# Patient Record
Sex: Male | Born: 1975
Health system: Southern US, Community
[De-identification: ages and names within clinical notes are randomized; demographics above are authoritative.]

## PROBLEM LIST (undated history)

## (undated) DIAGNOSIS — J45909 Unspecified asthma, uncomplicated: Secondary | ICD-10-CM

## (undated) HISTORY — DX: Unspecified asthma, uncomplicated: J45.909

---

## 2004-06-29 ENCOUNTER — Emergency Department: Payer: Self-pay | Admitting: Emergency Medicine

## 2015-12-03 ENCOUNTER — Encounter: Payer: Self-pay | Admitting: Family Medicine

## 2015-12-03 ENCOUNTER — Ambulatory Visit (INDEPENDENT_AMBULATORY_CARE_PROVIDER_SITE_OTHER): Payer: BLUE CROSS/BLUE SHIELD | Admitting: Family Medicine

## 2015-12-03 VITALS — BP 133/88 | HR 58 | Temp 98.5°F | Ht 63.5 in | Wt 169.0 lb

## 2015-12-03 DIAGNOSIS — J45909 Unspecified asthma, uncomplicated: Secondary | ICD-10-CM | POA: Insufficient documentation

## 2015-12-03 DIAGNOSIS — J4540 Moderate persistent asthma, uncomplicated: Secondary | ICD-10-CM

## 2015-12-03 DIAGNOSIS — Z23 Encounter for immunization: Secondary | ICD-10-CM | POA: Diagnosis not present

## 2015-12-03 MED ORDER — MONTELUKAST SODIUM 10 MG PO TABS: 10.0000 mg | ORAL_TABLET | Freq: Every day | ORAL | Status: DC

## 2015-12-03 MED ORDER — ALBUTEROL SULFATE HFA 108 (90 BASE) MCG/ACT IN AERS: 2.0000 | INHALATION_SPRAY | Freq: Four times a day (QID) | RESPIRATORY_TRACT | Status: DC | PRN

## 2015-12-03 MED ORDER — BUDESONIDE-FORMOTEROL FUMARATE 80-4.5 MCG/ACT IN AERO: 2.0000 | INHALATION_SPRAY | Freq: Two times a day (BID) | RESPIRATORY_TRACT | Status: DC

## 2015-12-03 NOTE — Assessment & Plan Note (Signed)
Will get him back on his medicine. Back here in 1 month for recheck. Call with any problems. Continue to monitor. Call with any problems.

## 2015-12-03 NOTE — Progress Notes (Signed)
BP 133/88 mmHg  Pulse 58  Temp(Src) 98.5 F (36.9 C)  Ht 5' 3.5" (1.613 m)  Wt 169 lb (76.658 kg)  BMI 29.46 kg/m2  SpO2 98%   Subjective:    Patient ID: Roger Reynolds, male    DOB: [birth year], 40 y.o.   MRN: [id number]  HPI: Roger Reynolds is a 40 y.o. male  Chief Complaint  Patient presents with  . Asthma    Patient needs a refill on his Symbicort and Albuterol inhaler   ASTHMA Asthma status: controlled Satisfied with current treatment?: yes Albuterol/rescue inhaler frequency: 2x a day when he is sick, uses it 1x a day when he's not sick Dyspnea frequency: Occasionally  Wheezing frequency: Mainly when he is sick, occasionally Cough frequency: 6x per week Nocturnal symptom frequency: Not much Limitation of activity: no Current upper respiratory symptoms: yes Triggers: When he's sick Failed/intolerant to following asthma meds: none Asthma meds in past: symbicort, albuterol Aerochamber/spacer use: no Visits to ER or Urgent Care in past year: yes Pneumovax: Given today Influenza: Refused  Relevant past medical, surgical, family and social history reviewed and updated as indicated. Interim medical history since our last visit reviewed. Allergies and medications reviewed and updated.  Review of Systems  Constitutional: Negative.   Respiratory: Positive for cough, shortness of breath and wheezing. Negative for apnea, choking, chest tightness and stridor.   Cardiovascular: Negative.   Psychiatric/Behavioral: Negative.     Per HPI unless specifically indicated above     Objective:    BP 133/88 mmHg  Pulse 58  Temp(Src) 98.5 F (36.9 C)  Ht 5' 3.5" (1.613 m)  Wt 169 lb (76.658 kg)  BMI 29.46 kg/m2  SpO2 98%  Wt Readings from Last 3 Encounters:  12/03/15 169 lb (76.658 kg)  12/19/13 159 lb (72.122 kg)    Physical Exam  Constitutional: He is oriented to person, place, and time. He appears well-developed and well-nourished. No distress.  HENT:  Head: Normocephalic and  atraumatic.  Right Ear: Hearing normal.  Left Ear: Hearing normal.  Nose: Nose normal.  Eyes: Conjunctivae and lids are normal. Right eye exhibits no discharge. Left eye exhibits no discharge. No scleral icterus.  Cardiovascular: Normal rate, regular rhythm, normal heart sounds and intact distal pulses.  Exam reveals no gallop and no friction rub.   No murmur heard. Pulmonary/Chest: Effort normal and breath sounds normal. No respiratory distress. He has no wheezes. He has no rales. He exhibits no tenderness.  Musculoskeletal: Normal range of motion.  Neurological: He is alert and oriented to person, place, and time.  Skin: Skin is warm, dry and intact. No rash noted. No erythema. No pallor.  Psychiatric: He has a normal mood and affect. His speech is normal and behavior is normal. Judgment and thought content normal. Cognition and memory are normal.  Nursing note and vitals reviewed.   No results found for this or any previous visit.    Assessment & Plan:   Problem List Items Addressed This Visit      Respiratory   Asthma - Primary    Will get him back on his medicine. Back here in 1 month for recheck. Call with any problems. Continue to monitor. Call with any problems.       Relevant Medications   albuterol (PROVENTIL HFA;VENTOLIN HFA) 108 (90 Base) MCG/ACT inhaler   budesonide-formoterol (SYMBICORT) 80-4.5 MCG/ACT inhaler   montelukast (SINGULAIR) 10 MG tablet    Other Visit Diagnoses    Immunization due  Pneumovax given today.    Relevant Orders    Pneumococcal polysaccharide vaccine 23-valent greater than or equal to 2yo subcutaneous/IM (Completed)        Follow up plan: Return in about 4 weeks (around 12/31/2015) for Asthma follow up.

## 2015-12-03 NOTE — Patient Instructions (Signed)
Pneumococcal Vaccine, Polyvalent solution for injection What is this medicine? PNEUMOCOCCAL VACCINE, POLYVALENT (NEU mo KOK al vak SEEN, pol ee VEY luhnt) is a vaccine to prevent pneumococcus bacteria infection. These bacteria are a major cause of ear infections, Strep throat infections, and serious pneumonia, meningitis, or blood infections worldwide. These vaccines help the body to produce antibodies (protective substances) that help your body defend against these bacteria. This vaccine is recommended for people 42 years of age and older with health problems. It is also recommended for all adults over 42 years old. This vaccine will not treat an infection. This medicine may be used for other purposes; ask your health care provider or pharmacist if you have questions. What should I tell my health care provider before I take this medicine? They need to know if you have any of these conditions: -bleeding problems -bone marrow or organ transplant -cancer, Hodgkin's disease -fever -infection -immune system problems -low platelet count in the blood -seizures -an unusual or allergic reaction to pneumococcal vaccine, diphtheria toxoid, other vaccines, latex, other medicines, foods, dyes, or preservatives -pregnant or trying to get pregnant -breast-feeding How should I use this medicine? This vaccine is for injection into a muscle or under the skin. It is given by a health care professional. A copy of Vaccine Information Statements will be given before each vaccination. Read this sheet carefully each time. The sheet may change frequently. Talk to your pediatrician regarding the use of this medicine in children. While this drug may be prescribed for children as young as 55 years of age for selected conditions, precautions do apply. Overdosage: If you think you have taken too much of this medicine contact a poison control center or emergency room at once. NOTE: This medicine is only for you. Do not share  this medicine with others. What if I miss a dose? It is important not to miss your dose. Call your doctor or health care professional if you are unable to keep an appointment. What may interact with this medicine? -medicines for cancer chemotherapy -medicines that suppress your immune function -medicines that treat or prevent blood clots like warfarin, enoxaparin, and dalteparin -steroid medicines like prednisone or cortisone This list may not describe all possible interactions. Give your health care provider a list of all the medicines, herbs, non-prescription drugs, or dietary supplements you use. Also tell them if you smoke, drink alcohol, or use illegal drugs. Some items may interact with your medicine. What should I watch for while using this medicine? Mild fever and pain should go away in 3 days or less. Report any unusual symptoms to your doctor or health care professional. What side effects may I notice from receiving this medicine? Side effects that you should report to your doctor or health care professional as soon as possible: -allergic reactions like skin rash, itching or hives, swelling of the face, lips, or tongue -breathing problems -confused -fever over 102 degrees F -pain, tingling, numbness in the hands or feet -seizures -unusual bleeding or bruising -unusual muscle weakness Side effects that usually do not require medical attention (report to your doctor or health care professional if they continue or are bothersome): -aches and pains -diarrhea -fever of 102 degrees F or less -headache -irritable -loss of appetite -pain, tender at site where injected -trouble sleeping This list may not describe all possible side effects. Call your doctor for medical advice about side effects. You may report side effects to FDA at 1-800-FDA-1088. Where should I keep my medicine? This  does not apply. This vaccine is given in a clinic, pharmacy, doctor's office, or other health care  setting and will not be stored at home. NOTE: This sheet is a summary. It may not cover all possible information. If you have questions about this medicine, talk to your doctor, pharmacist, or health care provider.    2016, Elsevier/Gold Standard. (2008-04-17 14:32:37) Pneumococcal Vaccine, Polyvalent solution for injection Qu es este medicamento? La [employer] ANTINEUMOCCICA POLIVALENTE es una vacuna que ayuda a prevenir la infeccin causada por cepas de neumococos. Estas bacterias son la causa principal de las infecciones del odo, infecciones de la garganta por estreptococos y neumona, meningitis o infecciones sanguneas graves en todo el mundo. Estas vacunas ayudan al cuerpo a producir anticuerpos (sustancias protectoras) que le ayudan a combatir estas bacterias. La vacuna se recomienda para las personas de 42 aos de edad o mayor con problemas de [employer]	[place]. Se recomienda tambin para todos los adultos mayores de 42 aos de [place]. Esta vacuna no sirve para tratar a una infeccin. Este medicamento puede ser utilizado para otros usos; si tiene alguna pregunta consulte con su proveedor de atencin mdica o con su farmacutico. Qu le debo informar a mi profesional de la salud antes de tomar este medicamento? Necesita saber si usted presenta alguno de los [employer] o situaciones: -problemas de sangrado -transplante de mdula sea u de rganos -cncer, enfermedad de Hodgkin -fiebre -infeccin -problemas del sistema inmunolgico -baja cantidad de plaquetas en la sangre -convulsiones -una reaccin alrgica o inusual a la vacuna antineumoccica, a la difteria toxoid, a otras vacunas, al ltex, a otros medicamentos, alimentos, colorantes o conservantes -si est embarazada o buscando quedar embarazada -si est amamantando a un beb Cmo debo utilizar este medicamento? Esta vacuna se administra mediante inyeccin por va intramuscular o subcutnea. Lo administra un profesional de [employer]. Recibir una copia de informacin escrita sobre la vacuna antes de cada vacuna. Asegrese de leer este folleto cada vez cuidadosamente. Este folleto puede cambiar con frecuencia. Hable con su pediatra para informarse acerca del uso de este medicamento en nios. Aunque este medicamento ha sido recetado a nios tan menores como de 2 aos de edad para condiciones selectivas, las precauciones se aplican. Sobredosis: Pngase en contacto inmediatamente con un centro toxicolgico o una sala de urgencia si usted cree que haya tomado demasiado medicamento. ATENCIN: [employer] es solo para usted. No comparta este medicamento con nadie. Qu sucede si me olvido de una dosis? Es importante no olvidar ninguna dosis. Informe a su mdico o a su profesional de la salud si no puede asistir a Photographer. Qu puede interactuar con este medicamento? -medicamentos para la quimioterapia contra el cncer -medicamentos que suprimen su funcin inmunolgica -medicamentos que tratan o previenen cogulos sanguneos, tales como warfarina, enoxaparina y dalteparina -medicamentos esteroideos, como la prednisona o la cortisona Puede ser que esta lista no menciona todas las posibles interacciones. Informe a su profesional de [employer]	[place] de [employer] productos a base de hierbas, medicamentos de [place] o suplementos nutritivos que est tomando. Si usted fuma, consume bebidas alcohlicas o si utiliza drogas ilegales, indqueselo tambin a su profesional de [employer]	[place]. Algunas sustancias pueden interactuar con su medicamento. A qu debo estar atento al usar [employer]? La fiebre leve y Conservation officer, historic buildings deben desaparecer despus de 3 das o menos. Informe a su mdico o su profesional de la salud si se presentan sntomas inusuales. Qu efectos secundarios puedo tener al [employer] este medicamento? Efectos secundarios que  debe informar a su mdico o a su profesional de la salud tan pronto como sea posible: -reacciones  alrgicas como erupcin cutnea, picazn o urticarias, hinchazn de la cara, labios o lengua -problemas respiratorios -confusin -fiebre ms de 102 grados F -dolor, hormigueo, entumecimiento en las manos o pies -convulsiones -sangrado, magulladuras inusuales -cansancio o debilidad inusual Efectos secundarios que, por lo general, no requieren atencin mdica (debe informarlos a su mdico o a su profesional de la salud si persisten o si son molestos): -molestias o dolores -diarrea -fiebre de 54 grados F o menos -dolor de Pensions consultant -irritablidad -prdida del apetito -dolor, sensibildad en el lugar de la inyeccin -dificultad para conciliar el sueo Puede ser que esta lista no menciona todos los posibles efectos secundarios. Comunquese a su mdico por asesoramiento mdico [employer]. Usted puede informar los efectos secundarios a la FDA por telfono al 1-800-FDA-1088. Dnde debo guardar mi medicina? No se aplica en este caso. Esta vacuna se administrar en una clnica, la oficina de su mdico o en el consultorio de un profesional de la salud y no necesitar guardarlo en su domicilio. ATENCIN: Este folleto es un resumen. Puede ser que no cubra toda la posible informacin. Si usted tiene preguntas acerca de esta medicina, consulte con su mdico, su farmacutico o su profesional de Technical sales engineer.    2016, Elsevier/Gold Standard. (2014-11-04 00:00:00)

## 2015-12-31 ENCOUNTER — Ambulatory Visit: Payer: BLUE CROSS/BLUE SHIELD | Admitting: Family Medicine

## 2016-11-24 ENCOUNTER — Encounter: Payer: Self-pay | Admitting: Family Medicine

## 2016-11-24 ENCOUNTER — Ambulatory Visit (INDEPENDENT_AMBULATORY_CARE_PROVIDER_SITE_OTHER): Payer: BLUE CROSS/BLUE SHIELD | Admitting: Family Medicine

## 2016-11-24 VITALS — BP 124/78 | HR 66 | Temp 98.2°F | Wt 172.0 lb

## 2016-11-24 DIAGNOSIS — J45909 Unspecified asthma, uncomplicated: Secondary | ICD-10-CM | POA: Diagnosis not present

## 2016-11-24 MED ORDER — ALBUTEROL SULFATE HFA 108 (90 BASE) MCG/ACT IN AERS: 2.0000 | INHALATION_SPRAY | Freq: Four times a day (QID) | RESPIRATORY_TRACT | 6 refills | Status: DC | PRN

## 2016-11-24 MED ORDER — MONTELUKAST SODIUM 10 MG PO TABS: 10.0000 mg | ORAL_TABLET | Freq: Every day | ORAL | 12 refills | Status: DC

## 2016-11-24 MED ORDER — BUDESONIDE-FORMOTEROL FUMARATE 160-4.5 MCG/ACT IN AERO: 2.0000 | INHALATION_SPRAY | Freq: Two times a day (BID) | RESPIRATORY_TRACT | 12 refills | Status: DC

## 2016-11-24 NOTE — Patient Instructions (Signed)
Follow up for Physical Exam 

## 2016-11-24 NOTE — Assessment & Plan Note (Signed)
Stable, resume symbicort daily. Continue singulair. Albuterol prn for flares.

## 2016-11-24 NOTE — Progress Notes (Signed)
   BP 124/78   Pulse 66   Temp 98.2 F (36.8 C)   Wt 172 lb (78 kg)   SpO2 96%   BMI 29.99 kg/m    Subjective:    Patient ID: Roger Reynolds, male    DOB: Jul 14, 1976, 41 y.o.   MRN: [id number]  HPI: Roger Reynolds is a 41 y.o. male  Chief Complaint  Patient presents with  . Asthma    follow up, he says he's doing well. He needs refills on his Singular and Albuterol. He states he doesn't take the Symbicort anymore.    Patient presents for asthma f/u and refills. Doing very well since starting singulair, has only had one albuterol inhaler filled the past year. Not using the symbicort because of cost. No allergy issues noted, wheezing, SOB, cough.   Relevant past medical, surgical, family and social history reviewed and updated as indicated. Interim medical history since our last visit reviewed. Allergies and medications reviewed and updated.  Review of Systems  Constitutional: Negative.   HENT: Negative.   Eyes: Negative.   Respiratory: Negative.   Cardiovascular: Negative.   Gastrointestinal: Negative.   Genitourinary: Negative.   Musculoskeletal: Negative.   Neurological: Negative.   Psychiatric/Behavioral: Negative.     Per HPI unless specifically indicated above     Objective:    BP 124/78   Pulse 66   Temp 98.2 F (36.8 C)   Wt 172 lb (78 kg)   SpO2 96%   BMI 29.99 kg/m   Wt Readings from Last 3 Encounters:  11/24/16 172 lb (78 kg)  12/03/15 169 lb (76.7 kg)  12/19/13 159 lb (72.1 kg)    Physical Exam  Constitutional: He is oriented to person, place, and time. He appears well-developed and well-nourished. No distress.  HENT:  Head: Atraumatic.  Eyes: Conjunctivae are normal. Pupils are equal, round, and reactive to light.  Neck: Normal range of motion. Neck supple.  Cardiovascular: Normal rate and normal heart sounds.   Pulmonary/Chest: Effort normal and breath sounds normal. No respiratory distress.  Musculoskeletal: Normal range of motion.  Neurological:  He is alert and oriented to person, place, and time.  Skin: Skin is warm and dry.  Psychiatric: He has a normal mood and affect. His behavior is normal.  Nursing note and vitals reviewed.   No results found for this or any previous visit.    Assessment & Plan:   Problem List Items Addressed This Visit      Respiratory   Asthma - Primary    Stable, resume symbicort daily. Continue singulair. Albuterol prn for flares.      Relevant Medications   montelukast (SINGULAIR) 10 MG tablet   budesonide-formoterol (SYMBICORT) 160-4.5 MCG/ACT inhaler   albuterol (PROVENTIL HFA;VENTOLIN HFA) 108 (90 Base) MCG/ACT inhaler       Follow up plan: Return for Physical exam.

## 2016-12-04 ENCOUNTER — Encounter: Payer: Self-pay | Admitting: Family Medicine

## 2016-12-04 ENCOUNTER — Ambulatory Visit (INDEPENDENT_AMBULATORY_CARE_PROVIDER_SITE_OTHER): Payer: BLUE CROSS/BLUE SHIELD | Admitting: Family Medicine

## 2016-12-04 VITALS — BP 110/74 | HR 61 | Temp 97.8°F | Ht 64.0 in | Wt 168.0 lb

## 2016-12-04 DIAGNOSIS — Z0000 Encounter for general adult medical examination without abnormal findings: Secondary | ICD-10-CM | POA: Diagnosis not present

## 2016-12-04 DIAGNOSIS — J45909 Unspecified asthma, uncomplicated: Secondary | ICD-10-CM | POA: Diagnosis not present

## 2016-12-04 LAB — UA/M W/RFLX CULTURE, ROUTINE
Bilirubin, UA: NEGATIVE
Glucose, UA: NEGATIVE
Ketones, UA: NEGATIVE
LEUKOCYTES UA: NEGATIVE
Nitrite, UA: NEGATIVE
PROTEIN UA: NEGATIVE
SPEC GRAV UA: 1.025 (ref 1.005–1.030)
Urobilinogen, Ur: 0.2 mg/dL (ref 0.2–1.0)
pH, UA: 5.5 (ref 5.0–7.5)

## 2016-12-04 LAB — MICROSCOPIC EXAMINATION
BACTERIA UA: NONE SEEN
RBC, UA: NONE SEEN /hpf (ref 0–?)

## 2016-12-04 NOTE — Patient Instructions (Signed)
Follow-up in one year.

## 2016-12-04 NOTE — Progress Notes (Signed)
BP 110/74   Pulse 61   Temp 97.8 F (36.6 C)   Ht 5\' 4"  (1.626 m)   Wt 168 lb (76.2 kg)   SpO2 98%   BMI 28.84 kg/m    Subjective:    Patient ID: Roger Reynolds, male    DOB: Feb 18, 1976, 41 y.o.   MRN: [id number]  HPI: Roger Reynolds is a 41 y.o. male presenting on 12/04/2016 for comprehensive medical examination. Current medical complaints include:none  He currently lives with: Interim Problems from his last visit: no  Depression Screen done today and results listed below:  Depression screen [facility] 2/9 12/04/2016  Decreased Interest 0  Down, Depressed, Hopeless 0  PHQ - 2 Score 0    The patient does not have a history of falls. I did not complete a risk assessment for falls. A plan of care for falls was not documented.   Past Medical History:  Past Medical History:  Diagnosis Date  . Asthma     Surgical History:  History reviewed. No pertinent surgical history.  Medications:  Current Outpatient Prescriptions on File Prior to Visit  Medication Sig  . albuterol (PROVENTIL HFA;VENTOLIN HFA) 108 (90 Base) MCG/ACT inhaler Inhale 2 puffs into the lungs every 6 (six) hours as needed for wheezing or shortness of breath.  . budesonide-formoterol (SYMBICORT) 160-4.5 MCG/ACT inhaler Inhale 2 puffs into the lungs 2 (two) times daily.  . montelukast (SINGULAIR) 10 MG tablet Take 1 tablet (10 mg total) by mouth at bedtime.   No current facility-administered medications on file prior to visit.     Allergies:  No Known Allergies  Social History:  Social History   Social History  . Marital status: Married    Spouse name: N/A  . Number of children: N/A  . Years of education: N/A   Occupational History  . Not on file.   Social History Main Topics  . Smoking status: Never Smoker  . Smokeless tobacco: Never Used  . Alcohol use Yes     Comment: on occasion  . Drug use: No  . Sexual activity: Yes   Other Topics Concern  . Not on file   Social History Narrative  . No  narrative on file   History  Smoking Status  . Never Smoker  Smokeless Tobacco  . Never Used   History  Alcohol Use  . Yes    Comment: on occasion    Family History:  Family History  Problem Relation Age of Onset  . Stroke Mother   . Alcohol abuse Father     Past medical history, surgical history, medications, allergies, family history and social history reviewed with patient today and changes made to appropriate areas of the chart.   Review of Systems - General ROS: negative Psychological ROS: negative Ophthalmic ROS: negative ENT ROS: negative Respiratory ROS: no cough, shortness of breath, or wheezing Cardiovascular ROS: no chest pain or dyspnea on exertion Gastrointestinal ROS: no abdominal pain, change in bowel habits, or black or bloody stools Genito-Urinary ROS: no dysuria, trouble voiding, or hematuria Musculoskeletal ROS: negative Neurological ROS: no TIA or stroke symptoms Dermatological ROS: negative All other ROS negative except what is listed above and in the HPI.      Objective:    BP 110/74   Pulse 61   Temp 97.8 F (36.6 C)   Ht 5\' 4"  (1.626 m)   Wt 168 lb (76.2 kg)   SpO2 98%   BMI 28.84 kg/m   Wt Readings from  Last 3 Encounters:  12/04/16 168 lb (76.2 kg)  11/24/16 172 lb (78 kg)  12/03/15 169 lb (76.7 kg)    Physical Exam  Constitutional: He is oriented to person, place, and time. He appears well-developed and well-nourished. No distress.  HENT:  Head: Atraumatic.  Right Ear: External ear normal.  Left Ear: External ear normal.  Nose: Nose normal.  Mouth/Throat: Oropharynx is clear and moist.  Eyes: Conjunctivae are normal. Pupils are equal, round, and reactive to light. No scleral icterus.  Neck: Normal range of motion. Neck supple. No thyromegaly present.  No carotid bruits  Cardiovascular: Normal rate, regular rhythm and normal heart sounds.   Pulmonary/Chest: Effort normal and breath sounds normal. No respiratory distress.    Abdominal: Soft. Bowel sounds are normal. He exhibits no mass. There is no tenderness.  Genitourinary:  Genitourinary Comments: Declined d/t no concerns  Musculoskeletal: Normal range of motion.  Lymphadenopathy:    He has no cervical adenopathy.  Neurological: He is alert and oriented to person, place, and time.  Skin: Skin is warm and dry.  Psychiatric: He has a normal mood and affect. His behavior is normal.  Nursing note and vitals reviewed.   No results found for this or any previous visit.    Assessment & Plan:   Problem List Items Addressed This Visit      Respiratory   Asthma    Stable, under good control with allergy regimen and symbicort. Albuterol for prn use.        Other Visit Diagnoses    Annual physical exam    -  Primary   Labs done today. UTD on health maintenance.    Relevant Orders   CBC with Differential/Platelet   Comprehensive metabolic panel   Lipid Panel w/o Chol/HDL Ratio   TSH   HgB A1c   UA/M w/rflx Culture, Routine      Discussed aspirin prophylaxis for myocardial infarction prevention and decision was it was not indicated  LABORATORY TESTING:  Health maintenance labs ordered today as discussed above.   The natural history of prostate cancer and ongoing controversy regarding screening and potential treatment outcomes of prostate cancer has been discussed with the patient. The meaning of a false positive PSA and a false negative PSA has been discussed. He indicates understanding of the limitations of this screening test and wishes not to proceed with screening PSA testing.  IMMUNIZATIONS:   - Tdap: Tetanus vaccination status reviewed: last tetanus booster within 10 years.  - Influenza: Up to date  PATIENT COUNSELING:    Sexuality: Discussed sexually transmitted diseases, partner selection, use of condoms, avoidance of unintended pregnancy  and contraceptive alternatives.   Advised to avoid cigarette smoking.  I discussed with the  patient that most people either abstain from alcohol or drink within safe limits (<=14/week and <=4 drinks/occasion for males, <=7/weeks and <= 3 drinks/occasion for females) and that the risk for alcohol disorders and other health effects rises proportionally with the number of drinks per week and how often a drinker exceeds daily limits.  Discussed cessation/primary prevention of drug use and availability of treatment for abuse.   Diet: Encouraged to adjust caloric intake to maintain  or achieve ideal body weight, to reduce intake of dietary saturated fat and total fat, to limit sodium intake by avoiding high sodium foods and not adding table salt, and to maintain adequate dietary potassium and calcium preferably from fresh fruits, vegetables, and low-fat dairy products.    stressed the importance  of regular exercise  Injury prevention: Discussed safety belts, safety helmets, smoke detector, smoking near bedding or upholstery.   Dental health: Discussed importance of regular tooth brushing, flossing, and dental visits.   Follow up plan: NEXT PREVENTATIVE PHYSICAL DUE IN 1 YEAR. Return in about 1 year (around 12/04/2017) for Physical Exam.

## 2016-12-04 NOTE — Assessment & Plan Note (Signed)
Stable, under good control with allergy regimen and symbicort. Albuterol for prn use.

## 2016-12-05 LAB — LIPID PANEL W/O CHOL/HDL RATIO
CHOLESTEROL TOTAL: 170 mg/dL (ref 100–199)
HDL: 47 mg/dL (ref >39)
LDL Calculated: 90 mg/dL (ref 0–99)
TRIGLYCERIDES: 163 mg/dL — AB (ref 0–149)
VLDL Cholesterol Cal: 33 mg/dL (ref 5–40)

## 2016-12-05 LAB — COMPREHENSIVE METABOLIC PANEL
A/G RATIO: 1.9 (ref 1.2–2.2)
ALK PHOS: 94 IU/L (ref 39–117)
ALT: 18 IU/L (ref 0–44)
AST: 18 IU/L (ref 0–40)
Albumin: 4.3 g/dL (ref 3.5–5.5)
BILIRUBIN TOTAL: 0.3 mg/dL (ref 0.0–1.2)
BUN/Creatinine Ratio: 15 (ref 9–20)
BUN: 15 mg/dL (ref 6–24)
CHLORIDE: 102 mmol/L (ref 96–106)
CO2: 23 mmol/L (ref 18–29)
Calcium: 9.7 mg/dL (ref 8.7–10.2)
Creatinine, Ser: 1 mg/dL (ref 0.76–1.27)
GFR calc Af Amer: 107 mL/min/{1.73_m2} (ref >59)
GFR calc non Af Amer: 92 mL/min/{1.73_m2} (ref >59)
Globulin, Total: 2.3 g/dL (ref 1.5–4.5)
Glucose: 95 mg/dL (ref 65–99)
POTASSIUM: 4.3 mmol/L (ref 3.5–5.2)
Sodium: 143 mmol/L (ref 134–144)
Total Protein: 6.6 g/dL (ref 6.0–8.5)

## 2016-12-05 LAB — CBC WITH DIFFERENTIAL/PLATELET
BASOS ABS: 0 10*3/uL (ref 0.0–0.2)
Basos: 1 %
EOS (ABSOLUTE): 0.1 10*3/uL (ref 0.0–0.4)
Eos: 1 %
HEMOGLOBIN: 16.3 g/dL (ref 13.0–17.7)
Hematocrit: 47.4 % (ref 37.5–51.0)
IMMATURE GRANS (ABS): 0 10*3/uL (ref 0.0–0.1)
IMMATURE GRANULOCYTES: 0 %
LYMPHS: 38 %
Lymphocytes Absolute: 3.2 10*3/uL — ABNORMAL HIGH (ref 0.7–3.1)
MCH: 30.7 pg (ref 26.6–33.0)
MCHC: 34.4 g/dL (ref 31.5–35.7)
MCV: 89 fL (ref 79–97)
MONOCYTES: 10 %
Monocytes Absolute: 0.9 10*3/uL (ref 0.1–0.9)
NEUTROS ABS: 4.3 10*3/uL (ref 1.4–7.0)
NEUTROS PCT: 50 %
Platelets: 294 10*3/uL (ref 150–379)
RBC: 5.31 x10E6/uL (ref 4.14–5.80)
RDW: 13.5 % (ref 12.3–15.4)
WBC: 8.5 10*3/uL (ref 3.4–10.8)

## 2016-12-05 LAB — HEMOGLOBIN A1C
Est. average glucose Bld gHb Est-mCnc: 111 mg/dL
HEMOGLOBIN A1C: 5.5 % (ref 4.8–5.6)

## 2016-12-05 LAB — TSH: TSH: 1.66 u[IU]/mL (ref 0.450–4.500)

## 2017-12-04 ENCOUNTER — Telehealth: Payer: Self-pay | Admitting: Family Medicine

## 2017-12-04 NOTE — Telephone Encounter (Signed)
Copied from [place]. Topic: Quick Communication - Rx Refill/Question >> Dec 04, 2017  2:45 PM Synthia Innocent wrote: Medication:  montelukast (SINGULAIR) 10 MG tablet    Has the patient contacted their pharmacy? Yes.     (Agent: If no, request that the patient contact the pharmacy for the refill.)   Preferred Pharmacy (with phone number or street name): [facility]   Agent: Please be advised that RX refills may take up to 3 business days. We ask that you follow-up with your pharmacy.   Requesting call once sent to Pharmacy

## 2017-12-05 ENCOUNTER — Other Ambulatory Visit: Payer: Self-pay

## 2017-12-05 MED ORDER — MONTELUKAST SODIUM 10 MG PO TABS: 10.0000 mg | ORAL_TABLET | Freq: Every day | ORAL | 12 refills | Status: DC

## 2017-12-07 ENCOUNTER — Telehealth: Payer: Self-pay | Admitting: Family Medicine

## 2017-12-07 ENCOUNTER — Encounter: Payer: BLUE CROSS/BLUE SHIELD | Admitting: Family Medicine

## 2017-12-07 MED ORDER — BUDESONIDE-FORMOTEROL FUMARATE 160-4.5 MCG/ACT IN AERO: 2.0000 | INHALATION_SPRAY | Freq: Two times a day (BID) | RESPIRATORY_TRACT | 1 refills | Status: DC

## 2017-12-07 MED ORDER — MONTELUKAST SODIUM 10 MG PO TABS: 10.0000 mg | ORAL_TABLET | Freq: Every day | ORAL | 1 refills | Status: DC

## 2017-12-07 NOTE — Telephone Encounter (Signed)
Refill sent this morning 

## 2017-12-07 NOTE — Telephone Encounter (Signed)
Refill on patients symbicort  Patient states refill needs to be sent to [place]   Thank you

## 2017-12-07 NOTE — Telephone Encounter (Signed)
2 month supply sent to his pharmacy, seeing [place] next month.

## 2017-12-07 NOTE — Telephone Encounter (Signed)
Patient needs refill on his singular sent to [place] in [place].    Thanks

## 2018-01-08 ENCOUNTER — Encounter: Payer: BLUE CROSS/BLUE SHIELD | Admitting: Family Medicine

## 2018-01-09 ENCOUNTER — Other Ambulatory Visit: Payer: Self-pay

## 2018-01-09 MED ORDER — BUDESONIDE-FORMOTEROL FUMARATE 160-4.5 MCG/ACT IN AERO: 2.0000 | INHALATION_SPRAY | Freq: Two times a day (BID) | RESPIRATORY_TRACT | 1 refills | Status: DC

## 2018-01-09 NOTE — Telephone Encounter (Signed)
Fax from pharmacy, refill on Symbicort 160/4.5 mcg

## 2018-01-30 ENCOUNTER — Telehealth: Payer: Self-pay

## 2018-01-30 NOTE — Telephone Encounter (Signed)
Request refill for symbicort 160/4.5 mcg. Upcoming appointment 02/01/2018 with Dr. Wynetta Emery.

## 2018-01-31 MED ORDER — BUDESONIDE-FORMOTEROL FUMARATE 160-4.5 MCG/ACT IN AERO: 2.0000 | INHALATION_SPRAY | Freq: Two times a day (BID) | RESPIRATORY_TRACT | 1 refills | Status: DC

## 2018-02-01 ENCOUNTER — Encounter: Payer: BLUE CROSS/BLUE SHIELD | Admitting: Family Medicine

## 2018-02-01 ENCOUNTER — Encounter

## 2018-02-20 ENCOUNTER — Other Ambulatory Visit: Payer: Self-pay

## 2018-02-20 MED ORDER — BUDESONIDE-FORMOTEROL FUMARATE 160-4.5 MCG/ACT IN AERO
2.0000 | INHALATION_SPRAY | Freq: Two times a day (BID) | RESPIRATORY_TRACT | 1 refills | Status: DC
Start: 2018-02-20 — End: 2018-02-22

## 2018-02-20 NOTE — Telephone Encounter (Signed)
Patient has upcoming appointment 02/22/2018.

## 2018-02-20 NOTE — Telephone Encounter (Signed)
Refill for 2 moths given 2 weeks ago- he should not be due... Can you find out what's going on?

## 2018-02-20 NOTE — Telephone Encounter (Signed)
Looks like the [facility] not [facility]. Tried calling patient to see where he wanted this medication, no answer. Will try again later.

## 2018-02-21 NOTE — Telephone Encounter (Signed)
Patient has already picked up his medication at [facility]. Patient also has an appointment tomorrow at 3:00PM for his physical.

## 2018-02-22 ENCOUNTER — Ambulatory Visit (INDEPENDENT_AMBULATORY_CARE_PROVIDER_SITE_OTHER): Payer: BLUE CROSS/BLUE SHIELD | Admitting: Family Medicine

## 2018-02-22 ENCOUNTER — Encounter: Payer: Self-pay | Admitting: Family Medicine

## 2018-02-22 VITALS — BP 129/73 | HR 59 | Ht 63.4 in | Wt 178.4 lb

## 2018-02-22 DIAGNOSIS — Z0000 Encounter for general adult medical examination without abnormal findings: Secondary | ICD-10-CM

## 2018-02-22 DIAGNOSIS — Z114 Encounter for screening for human immunodeficiency virus [HIV]: Secondary | ICD-10-CM | POA: Diagnosis not present

## 2018-02-22 DIAGNOSIS — J45909 Unspecified asthma, uncomplicated: Secondary | ICD-10-CM

## 2018-02-22 LAB — UA/M W/RFLX CULTURE, ROUTINE
Bilirubin, UA: NEGATIVE
GLUCOSE, UA: NEGATIVE
Ketones, UA: NEGATIVE
LEUKOCYTES UA: NEGATIVE
Nitrite, UA: NEGATIVE
PH UA: 7 (ref 5.0–7.5)
Protein, UA: NEGATIVE
RBC UA: NEGATIVE
SPEC GRAV UA: 1.015 (ref 1.005–1.030)
Urobilinogen, Ur: 0.2 mg/dL (ref 0.2–1.0)

## 2018-02-22 MED ORDER — ALBUTEROL SULFATE HFA 108 (90 BASE) MCG/ACT IN AERS: 2.0000 | INHALATION_SPRAY | Freq: Four times a day (QID) | RESPIRATORY_TRACT | 12 refills | Status: DC | PRN

## 2018-02-22 MED ORDER — BUDESONIDE-FORMOTEROL FUMARATE 160-4.5 MCG/ACT IN AERO: 2.0000 | INHALATION_SPRAY | Freq: Two times a day (BID) | RESPIRATORY_TRACT | 4 refills | Status: DC

## 2018-02-22 MED ORDER — MONTELUKAST SODIUM 10 MG PO TABS: 10.0000 mg | ORAL_TABLET | Freq: Every day | ORAL | 3 refills | Status: DC

## 2018-02-22 NOTE — Progress Notes (Signed)
BP 129/73 (BP Location: Right Arm, Patient Position: Sitting, Cuff Size: Normal)   Pulse (!) 59   Ht 5' 3.4" (1.61 m)   Wt 178 lb 7 oz (80.9 kg)   SpO2 96%   BMI 31.21 kg/m    Subjective:    Patient ID: Roger Reynolds, male    DOB: 1975-12-14, 42 y.o.   MRN: [id number]  HPI: Wilma Wuthrich is a 42 y.o. male presenting on 02/22/2018 for comprehensive medical examination. Current medical complaints include:  ASTHMA Asthma status: controlled Satisfied with current treatment?: yes Albuterol/rescue inhaler frequency: rarely Dyspnea frequency: rarely Wheezing frequency: rarely Cough frequency: rarely Nocturnal symptom frequency: rarely Limitation of activity: no Current upper respiratory symptoms: no Triggers:  Home peak flows: Last Spirometry:  Failed/intolerant to following asthma meds:  Asthma meds in past:  Aerochamber/spacer use: no Visits to ER or Urgent Care in past year: no Pneumovax: Up to Date Influenza: Up to Date  He currently lives with: wife and kids Interim Problems from his last visit: no  Depression Screen done today and results listed below:  Depression screen [facility] 2/9 02/22/2018 12/04/2016  Decreased Interest 0 0  Down, Depressed, Hopeless 0 0  PHQ - 2 Score 0 0  Altered sleeping 0 -  Tired, decreased energy 0 -  Change in appetite 0 -  Feeling bad or failure about yourself  0 -  Trouble concentrating 0 -  Moving slowly or fidgety/restless 0 -  Suicidal thoughts 0 -  PHQ-9 Score 0 -  Difficult doing work/chores Not difficult at all -    Past Medical History:  Past Medical History:  Diagnosis Date  . Asthma     Surgical History:  History reviewed. No pertinent surgical history.  Medications:  No current outpatient medications on file prior to visit.   No current facility-administered medications on file prior to visit.     Allergies:  No Known Allergies  Social History:  Social History   Socioeconomic History  . Marital status: Married   Spouse name: Not on file  . Number of children: Not on file  . Years of education: Not on file  . Highest education level: Not on file  Occupational History  . Not on file  Social Needs  . Financial resource strain: Not on file  . Food insecurity:    Worry: Not on file    Inability: Not on file  . Transportation needs:    Medical: Not on file    Non-medical: Not on file  Tobacco Use  . Smoking status: Never Smoker  . Smokeless tobacco: Never Used  Substance and Sexual Activity  . Alcohol use: Yes    Comment: on occasion  . Drug use: No  . Sexual activity: Yes  Lifestyle  . Physical activity:    Days per week: Not on file    Minutes per session: Not on file  . Stress: Not on file  Relationships  . Social connections:    Talks on phone: Not on file    Gets together: Not on file    Attends religious service: Not on file    Active member of club or organization: Not on file    Attends meetings of clubs or organizations: Not on file    Relationship status: Not on file  . Intimate partner violence:    Fear of current or ex partner: Not on file    Emotionally abused: Not on file    Physically abused: Not on file  Forced sexual activity: Not on file  Other Topics Concern  . Not on file  Social History Narrative  . Not on file   Social History   Tobacco Use  Smoking Status Never Smoker  Smokeless Tobacco Never Used   Social History   Substance and Sexual Activity  Alcohol Use Yes   Comment: on occasion    Family History:  Family History  Problem Relation Age of Onset  . Stroke Mother   . Alcohol abuse Father     Past medical history, surgical history, medications, allergies, family history and social history reviewed with patient today and changes made to appropriate areas of the chart.   Review of Systems  Constitutional: Negative.   HENT: Negative.   Eyes: Negative.   Respiratory: Negative.   Cardiovascular: Negative.   Gastrointestinal: Negative.     Genitourinary: Negative.   Musculoskeletal: Negative.   Skin: Negative.   Neurological: Negative.   Endo/Heme/Allergies: Negative for environmental allergies and polydipsia. Does not bruise/bleed easily.  Psychiatric/Behavioral: Negative.     All other ROS negative except what is listed above and in the HPI.      Objective:    BP 129/73 (BP Location: Right Arm, Patient Position: Sitting, Cuff Size: Normal)   Pulse (!) 59   Ht 5' 3.4" (1.61 m)   Wt 178 lb 7 oz (80.9 kg)   SpO2 96%   BMI 31.21 kg/m   Wt Readings from Last 3 Encounters:  02/22/18 178 lb 7 oz (80.9 kg)  12/04/16 168 lb (76.2 kg)  11/24/16 172 lb (78 kg)    Physical Exam  Constitutional: He is oriented to person, place, and time. He appears well-developed and well-nourished. No distress.  HENT:  Head: Normocephalic and atraumatic.  Right Ear: Hearing, tympanic membrane, external ear and ear canal normal.  Left Ear: Hearing, tympanic membrane, external ear and ear canal normal.  Nose: Nose normal.  Mouth/Throat: Uvula is midline, oropharynx is clear and moist and mucous membranes are normal. No oropharyngeal exudate.  Eyes: Pupils are equal, round, and reactive to light. Conjunctivae, EOM and lids are normal. Right eye exhibits no discharge. Left eye exhibits no discharge. No scleral icterus.  Neck: Normal range of motion. Neck supple. No JVD present. No tracheal deviation present. No thyromegaly present.  Cardiovascular: Normal rate, regular rhythm, normal heart sounds and intact distal pulses. Exam reveals no gallop and no friction rub.  No murmur heard. Pulmonary/Chest: Effort normal and breath sounds normal. No stridor. No respiratory distress. He has no wheezes. He has no rales. He exhibits no tenderness.  Abdominal: Soft. Bowel sounds are normal. He exhibits no distension and no mass. There is no tenderness. There is no rebound and no guarding. No hernia. Hernia confirmed negative in the right inguinal area  and confirmed negative in the left inguinal area.  Genitourinary: Testes normal and penis normal. Cremasteric reflex is present. Right testis shows no mass, no swelling and no tenderness. Right testis is descended. Cremasteric reflex is not absent on the right side. Left testis shows no mass, no swelling and no tenderness. Left testis is descended. Cremasteric reflex is not absent on the left side. Uncircumcised. No phimosis, paraphimosis, hypospadias, penile erythema or penile tenderness. No discharge found.  Musculoskeletal: Normal range of motion. He exhibits no edema, tenderness or deformity.  Lymphadenopathy:    He has no cervical adenopathy.  Neurological: He is alert and oriented to person, place, and time. He displays normal reflexes. No cranial nerve deficit  or sensory deficit. He exhibits normal muscle tone. Coordination normal.  Skin: Skin is warm, dry and intact. Capillary refill takes less than 2 seconds. No rash noted. He is not diaphoretic. No erythema. No pallor.  Psychiatric: He has a normal mood and affect. His speech is normal and behavior is normal. Judgment and thought content normal. Cognition and memory are normal.  Nursing note and vitals reviewed.   Results for orders placed or performed in visit on 12/04/16  Microscopic Examination  Result Value Ref Range   WBC, UA 0-5 0 - 5 /hpf   RBC, UA None seen 0 - 2 /hpf   Epithelial Cells (non renal) 0-10 0 - 10 /hpf   Bacteria, UA None seen None seen/Few  CBC with Differential/Platelet  Result Value Ref Range   WBC 8.5 3.4 - 10.8 x10E3/uL   RBC 5.31 4.14 - 5.80 x10E6/uL   Hemoglobin 16.3 13.0 - 17.7 g/dL   Hematocrit 47.4 37.5 - 51.0 %   MCV 89 79 - 97 fL   MCH 30.7 26.6 - 33.0 pg   MCHC 34.4 31.5 - 35.7 g/dL   RDW 13.5 12.3 - 15.4 %   Platelets 294 150 - 379 x10E3/uL   Neutrophils 50 Not Estab. %   Lymphs 38 Not Estab. %   Monocytes 10 Not Estab. %   Eos 1 Not Estab. %   Basos 1 Not Estab. %   Neutrophils Absolute  4.3 1.4 - 7.0 x10E3/uL   Lymphocytes Absolute 3.2 (H) 0.7 - 3.1 x10E3/uL   Monocytes Absolute 0.9 0.1 - 0.9 x10E3/uL   EOS (ABSOLUTE) 0.1 0.0 - 0.4 x10E3/uL   Basophils Absolute 0.0 0.0 - 0.2 x10E3/uL   Immature Granulocytes 0 Not Estab. %   Immature Grans (Abs) 0.0 0.0 - 0.1 x10E3/uL  Comprehensive metabolic panel  Result Value Ref Range   Glucose 95 65 - 99 mg/dL   BUN 15 6 - 24 mg/dL   Creatinine, Ser 1.00 0.76 - 1.27 mg/dL   GFR calc non Af Amer 92 >59 mL/min/1.73   GFR calc Af Amer 107 >59 mL/min/1.73   BUN/Creatinine Ratio 15 9 - 20   Sodium 143 134 - 144 mmol/L   Potassium 4.3 3.5 - 5.2 mmol/L   Chloride 102 96 - 106 mmol/L   CO2 23 18 - 29 mmol/L   Calcium 9.7 8.7 - 10.2 mg/dL   Total Protein 6.6 6.0 - 8.5 g/dL   Albumin 4.3 3.5 - 5.5 g/dL   Globulin, Total 2.3 1.5 - 4.5 g/dL   Albumin/Globulin Ratio 1.9 1.2 - 2.2   Bilirubin Total 0.3 0.0 - 1.2 mg/dL   Alkaline Phosphatase 94 39 - 117 IU/L   AST 18 0 - 40 IU/L   ALT 18 0 - 44 IU/L  Lipid Panel w/o Chol/HDL Ratio  Result Value Ref Range   Cholesterol, Total 170 100 - 199 mg/dL   Triglycerides 163 (H) 0 - 149 mg/dL   HDL 47 >39 mg/dL   VLDL Cholesterol Cal 33 5 - 40 mg/dL   LDL Calculated 90 0 - 99 mg/dL  TSH  Result Value Ref Range   TSH 1.660 0.450 - 4.500 uIU/mL  HgB A1c  Result Value Ref Range   Hgb A1c MFr Bld 5.5 4.8 - 5.6 %   Est. average glucose Bld gHb Est-mCnc 111 mg/dL  UA/M w/rflx Culture, Routine  Result Value Ref Range   Specific Gravity, UA 1.025 1.005 - 1.030   pH, UA 5.5 5.0 -  7.5   Color, UA Yellow Yellow   Appearance Ur Clear Clear   Leukocytes, UA Negative Negative   Protein, UA Negative Negative/Trace   Glucose, UA Negative Negative   Ketones, UA Negative Negative   RBC, UA Trace (A) Negative   Bilirubin, UA Negative Negative   Urobilinogen, Ur 0.2 0.2 - 1.0 mg/dL   Nitrite, UA Negative Negative   Microscopic Examination See below:       Assessment & Plan:   Problem List Items  Addressed This Visit      Respiratory   Asthma    Under good control. Continue current regimen. Continue to monitor. Call with any concerns.       Relevant Medications   montelukast (SINGULAIR) 10 MG tablet   budesonide-formoterol (SYMBICORT) 160-4.5 MCG/ACT inhaler   albuterol (PROVENTIL HFA;VENTOLIN HFA) 108 (90 Base) MCG/ACT inhaler    Other Visit Diagnoses    Routine general medical examination at a health care facility    -  Primary   Vaccines up to date/Declined. Screening labs checked today. Continue diet and exercise. Call with any concerns.    Relevant Orders   CBC with Differential/Platelet   Comprehensive metabolic panel   Lipid Panel w/o Chol/HDL Ratio   TSH   UA/M w/rflx Culture, Routine   Screening for HIV (human immunodeficiency virus)       Labs drawn today. Await results.    Relevant Orders   HIV antibody      LABORATORY TESTING:  Health maintenance labs ordered today as discussed above.   IMMUNIZATIONS:   - Tdap: Tetanus vaccination status reviewed: Refused - Influenza: Postponed to flu season - Pneumovax: Up to date   PATIENT COUNSELING:    Sexuality: Discussed sexually transmitted diseases, partner selection, use of condoms, avoidance of unintended pregnancy  and contraceptive alternatives.   Advised to avoid cigarette smoking.  I discussed with the patient that most people either abstain from alcohol or drink within safe limits (<=14/week and <=4 drinks/occasion for males, <=7/weeks and <= 3 drinks/occasion for females) and that the risk for alcohol disorders and other health effects rises proportionally with the number of drinks per week and how often a drinker exceeds daily limits.  Discussed cessation/primary prevention of drug use and availability of treatment for abuse.   Diet: Encouraged to adjust caloric intake to maintain  or achieve ideal body weight, to reduce intake of dietary saturated fat and total fat, to limit sodium intake by avoiding  high sodium foods and not adding table salt, and to maintain adequate dietary potassium and calcium preferably from fresh fruits, vegetables, and low-fat dairy products.    stressed the importance of regular exercise  Injury prevention: Discussed safety belts, safety helmets, smoke detector, smoking near bedding or upholstery.   Dental health: Discussed importance of regular tooth brushing, flossing, and dental visits.   Follow up plan: NEXT PREVENTATIVE PHYSICAL DUE IN 1 YEAR. Return in about 1 year (around 02/23/2019) for Physical.

## 2018-02-22 NOTE — Assessment & Plan Note (Signed)
Under good control. Continue current regimen. Continue to monitor. Call with any concerns. 

## 2018-02-22 NOTE — Patient Instructions (Signed)

## 2018-02-23 LAB — CBC WITH DIFFERENTIAL/PLATELET
BASOS: 0 %
Basophils Absolute: 0 10*3/uL (ref 0.0–0.2)
EOS (ABSOLUTE): 0.3 10*3/uL (ref 0.0–0.4)
EOS: 4 %
HEMATOCRIT: 42.7 % (ref 37.5–51.0)
HEMOGLOBIN: 15.2 g/dL (ref 13.0–17.7)
IMMATURE GRANS (ABS): 0 10*3/uL (ref 0.0–0.1)
IMMATURE GRANULOCYTES: 0 %
LYMPHS: 38 %
Lymphocytes Absolute: 3.2 10*3/uL — ABNORMAL HIGH (ref 0.7–3.1)
MCH: 30.5 pg (ref 26.6–33.0)
MCHC: 35.6 g/dL (ref 31.5–35.7)
MCV: 86 fL (ref 79–97)
MONOCYTES: 8 %
MONOS ABS: 0.7 10*3/uL (ref 0.1–0.9)
NEUTROS PCT: 50 %
Neutrophils Absolute: 4.3 10*3/uL (ref 1.4–7.0)
Platelets: 243 10*3/uL (ref 150–450)
RBC: 4.99 x10E6/uL (ref 4.14–5.80)
RDW: 14.1 % (ref 12.3–15.4)
WBC: 8.5 10*3/uL (ref 3.4–10.8)

## 2018-02-23 LAB — COMPREHENSIVE METABOLIC PANEL
ALT: 31 IU/L (ref 0–44)
AST: 28 IU/L (ref 0–40)
Albumin/Globulin Ratio: 2.2 (ref 1.2–2.2)
Albumin: 4.6 g/dL (ref 3.5–5.5)
Alkaline Phosphatase: 84 IU/L (ref 39–117)
BUN/Creatinine Ratio: 18 (ref 9–20)
BUN: 17 mg/dL (ref 6–24)
Bilirubin Total: 0.3 mg/dL (ref 0.0–1.2)
CALCIUM: 9.3 mg/dL (ref 8.7–10.2)
CO2: 23 mmol/L (ref 20–29)
CREATININE: 0.95 mg/dL (ref 0.76–1.27)
Chloride: 105 mmol/L (ref 96–106)
GFR, EST AFRICAN AMERICAN: 113 mL/min/{1.73_m2} (ref >59)
GFR, EST NON AFRICAN AMERICAN: 98 mL/min/{1.73_m2} (ref >59)
GLOBULIN, TOTAL: 2.1 g/dL (ref 1.5–4.5)
Glucose: 116 mg/dL — ABNORMAL HIGH (ref 65–99)
Potassium: 3.9 mmol/L (ref 3.5–5.2)
SODIUM: 141 mmol/L (ref 134–144)
TOTAL PROTEIN: 6.7 g/dL (ref 6.0–8.5)

## 2018-02-23 LAB — LIPID PANEL W/O CHOL/HDL RATIO
Cholesterol, Total: 175 mg/dL (ref 100–199)
HDL: 51 mg/dL (ref >39)
LDL CALC: 87 mg/dL (ref 0–99)
TRIGLYCERIDES: 184 mg/dL — AB (ref 0–149)
VLDL Cholesterol Cal: 37 mg/dL (ref 5–40)

## 2018-02-23 LAB — TSH: TSH: 1.32 u[IU]/mL (ref 0.450–4.500)

## 2018-02-23 LAB — HIV ANTIBODY (ROUTINE TESTING W REFLEX): HIV Screen 4th Generation wRfx: NONREACTIVE

## 2018-04-05 ENCOUNTER — Other Ambulatory Visit: Payer: Self-pay

## 2018-04-05 ENCOUNTER — Encounter: Payer: Self-pay | Admitting: Emergency Medicine

## 2018-04-05 DIAGNOSIS — Z79899 Other long term (current) drug therapy: Secondary | ICD-10-CM | POA: Insufficient documentation

## 2018-04-05 DIAGNOSIS — M791 Myalgia, unspecified site: Secondary | ICD-10-CM | POA: Diagnosis not present

## 2018-04-05 DIAGNOSIS — M7918 Myalgia, other site: Secondary | ICD-10-CM | POA: Insufficient documentation

## 2018-04-05 DIAGNOSIS — R252 Cramp and spasm: Secondary | ICD-10-CM | POA: Diagnosis not present

## 2018-04-05 DIAGNOSIS — R51 Headache: Secondary | ICD-10-CM | POA: Insufficient documentation

## 2018-04-05 DIAGNOSIS — M25512 Pain in left shoulder: Secondary | ICD-10-CM | POA: Insufficient documentation

## 2018-04-05 DIAGNOSIS — J45909 Unspecified asthma, uncomplicated: Secondary | ICD-10-CM | POA: Insufficient documentation

## 2018-04-05 NOTE — ED Triage Notes (Signed)
Pt arrives ambulatory to triage with c/o left sided head pain x 12 days. Pt reports that pain comes and goes. Pt denies taking medicine for pain. Pt is in NAD.

## 2018-04-06 ENCOUNTER — Emergency Department
Admission: EM | Admit: 2018-04-06 | Discharge: 2018-04-06 | Disposition: A | Discharge status: Home / Self Care | Payer: BLUE CROSS/BLUE SHIELD | Attending: Emergency Medicine | Admitting: Emergency Medicine

## 2018-04-06 ENCOUNTER — Emergency Department: Payer: BLUE CROSS/BLUE SHIELD

## 2018-04-06 DIAGNOSIS — M25512 Pain in left shoulder: Secondary | ICD-10-CM | POA: Diagnosis not present

## 2018-04-06 DIAGNOSIS — M7918 Myalgia, other site: Secondary | ICD-10-CM

## 2018-04-06 DIAGNOSIS — R51 Headache: Secondary | ICD-10-CM

## 2018-04-06 DIAGNOSIS — R519 Headache, unspecified: Secondary | ICD-10-CM

## 2018-04-06 MED ORDER — CYCLOBENZAPRINE HCL 10 MG PO TABS: 10.0000 mg | ORAL_TABLET | Freq: Three times a day (TID) | ORAL | 0 refills | Status: DC | PRN

## 2018-04-06 MED ORDER — CYCLOBENZAPRINE HCL 10 MG PO TABS
5.0000 mg | ORAL_TABLET | Freq: Once | ORAL | Status: AC
  Administered 2018-04-06: 5 mg via ORAL
  Filled 2018-04-06: qty 1

## 2018-04-06 MED ORDER — KETOROLAC TROMETHAMINE 60 MG/2ML IM SOLN
60.0000 mg | Freq: Once | INTRAMUSCULAR | Status: AC
Start: 2018-04-06 — End: 2018-04-06
  Administered 2018-04-06: 60 mg via INTRAMUSCULAR
  Filled 2018-04-06: qty 2

## 2018-04-06 MED ORDER — ETODOLAC 200 MG PO CAPS: 200.0000 mg | ORAL_CAPSULE | Freq: Three times a day (TID) | ORAL | 0 refills | Status: DC

## 2018-04-06 NOTE — ED Notes (Addendum)
Patient c/o left-side posterior head pain X 12 days. Patient reports patient started in left arm (points to shoulder), but is now in posterior head. Patient denies N/V. Patient rates pain at 7 of 10. Patient denies visual changes

## 2018-04-06 NOTE — ED Notes (Signed)
ED Provider at bedside. 

## 2018-04-06 NOTE — ED Notes (Signed)
Reviewed discharge instructions, follow-up care, and prescriptions with patient. Patient verbalized understanding of all information reviewed. Patient stable, with no distress noted at this time.    This RN used interpreter to discharge patient.

## 2018-04-06 NOTE — ED Provider Notes (Signed)
[facility] Emergency Department Provider Note   ____________________________________________   First MD Initiated Contact with Patient 04/06/18 0022     (approximate)  I have reviewed the triage vital signs and the nursing notes.   HISTORY  Chief Complaint Headache (Left side)    HPI Roger Reynolds is a 42 y.o. male who comes into the hospital today with left sided pain for the past 12 days.  He reports that it started in his shoulder between his neck and shoulder.  It has also gone up into his head and then it also moved down his arms.  He reports that it feels like cramps.  He has not taken any medicine for the pain.  He is never had this before.  The patient denies any loss of consciousness, dizziness, chest pain, nausea, vomiting.  He reports that his pain is currently in his shoulder and he rates it as 7 out of 10 in intensity.  He has had no blurred vision with the symptoms.  The patient is here today for evaluation.   Past Medical History:  Diagnosis Date  . Asthma     Patient Active Problem List   Diagnosis Date Noted  . Asthma 12/03/2015    History reviewed. No pertinent surgical history.  Prior to Admission medications   Medication Sig Start Date End Date Taking? Authorizing Provider  albuterol (PROVENTIL HFA;VENTOLIN HFA) 108 (90 Base) MCG/ACT inhaler Inhale 2 puffs into the lungs every 6 (six) hours as needed for wheezing or shortness of breath. 02/22/18   Johnson, Megan P, DO  budesonide-formoterol (SYMBICORT) 160-4.5 MCG/ACT inhaler Inhale 2 puffs into the lungs 2 (two) times daily. 02/22/18   Johnson, Megan P, DO  cyclobenzaprine (FLEXERIL) 10 MG tablet Take 1 tablet (10 mg total) by mouth 3 (three) times daily as needed for muscle spasms. 04/06/18   Loney Hering, MD  etodolac (LODINE) 200 MG capsule Take 1 capsule (200 mg total) by mouth every 8 (eight) hours. 04/06/18   Loney Hering, MD  montelukast (SINGULAIR) 10 MG tablet Take  1 tablet (10 mg total) by mouth at bedtime. 02/22/18   Valerie Roys, DO    Allergies Patient has no known allergies.  Family History  Problem Relation Age of Onset  . Stroke Mother   . Alcohol abuse Father     Social History Social History   Tobacco Use  . Smoking status: Never Smoker  . Smokeless tobacco: Never Used  Substance Use Topics  . Alcohol use: Yes    Comment: on occasion  . Drug use: No    Review of Systems  Constitutional: No fever/chills Eyes: No visual changes. ENT: No sore throat. Cardiovascular: Denies chest pain. Respiratory: Denies shortness of breath. Gastrointestinal: No abdominal pain.  No nausea, no vomiting.  No diarrhea.  No constipation. Genitourinary: Negative for dysuria. Musculoskeletal: Left shoulder pain Skin: Negative for rash. Neurological: Head pain   ____________________________________________   PHYSICAL EXAM:  VITAL SIGNS: ED Triage Vitals  Enc Vitals Group     BP 04/05/18 2245 140/90     Pulse Rate 04/05/18 2245 60     Resp 04/05/18 2245 16     Temp 04/05/18 2245 98.4 F (36.9 C)     Temp Source 04/05/18 2245 Oral     SpO2 04/05/18 2245 97 %     Weight 04/05/18 2245 170 lb (77.1 kg)     Height 04/05/18 2245 5\' 3"  (1.6 m)     Head  Circumference --      Peak Flow --      Pain Score 04/05/18 2248 7     Pain Loc --      Pain Edu? --      Excl. in [place]? --     Constitutional: Alert and oriented. Well appearing and in mild distress. Eyes: Conjunctivae are normal. PERRL. EOMI. Head: Atraumatic. Nose: No congestion/rhinnorhea. Mouth/Throat: Mucous membranes are moist.  Oropharynx non-erythematous. Cardiovascular: Normal rate, regular rhythm. Grossly normal heart sounds.  Good peripheral circulation. Respiratory: Normal respiratory effort.  No retractions. Lungs CTAB. Gastrointestinal: Soft and nontender. No distention.  Positive bowel sounds Musculoskeletal: Mild tenderness to palpation of muscles between the left  neck and left shoulder. Neurologic:  Normal speech and language. No gross focal neurologic deficits are appreciated. No gait instability.  Cranial nerves II through XII grossly intact with no focal motor neuro deficit Skin:  Skin is warm, dry and intact.  Psychiatric: Mood and affect are normal.   ____________________________________________   LABS (all labs ordered are listed, but only abnormal results are displayed)  Labs Reviewed - No data to display ____________________________________________  EKG  none ____________________________________________  RADIOLOGY  ED MD interpretation:  DG shoulder: Negative left shoulder radiographs  Official radiology report(s): Dg Shoulder Left  Result Date: 04/06/2018 CLINICAL DATA:  Posterior head and shoulder pain for 12 days. No reported acute injury. EXAM: LEFT SHOULDER - 2+ VIEW COMPARISON:  None. FINDINGS: The mineralization and alignment are normal. There is no evidence of acute fracture or dislocation. The subacromial space is preserved. There are no significant arthropathic changes. IMPRESSION: Negative left shoulder radiographs. Electronically Signed   By: Richardean Sale M.D.   On: 04/06/2018 01:25    ____________________________________________   PROCEDURES  Procedure(s) performed: None  Procedures  Critical Care performed: No  ____________________________________________   INITIAL IMPRESSION / ASSESSMENT AND PLAN / ED COURSE  As part of my medical decision making, I reviewed the following data within the electronic MEDICAL RECORD NUMBER Notes from prior ED visits and [place] Controlled Substance Database   This is a 42 year old male who comes into the hospital today with 12 days of alternating pain between his head shoulder and arm.  The patient states that his pain is in his shoulder at this point.  He reports that he puts his hand behind his back it makes his shoulder hurt more.  My differential diagnosis includes  musculoskeletal pain, rotator cuff injury  I did send the patient for an x-ray of his left shoulder and I also gave him a shot of Toradol and a dose of Flexeril.  When I did reassess the patient he states that his shoulder pain was gone but the pain did move back to the back of his head.  I feel that this may be some musculoskeletal pain.  He did ask about the possibility of a stroke.  He has no weakness and no sensory deficits.  I did offer to perform a CT scan on the patient but he declined.  He will follow-up with his primary care physician as well as orthopedic surgery for further evaluation of his symptoms.      ____________________________________________   FINAL CLINICAL IMPRESSION(S) / ED DIAGNOSES  Final diagnoses:  Acute pain of left shoulder  Nonintractable headache, unspecified chronicity pattern, unspecified headache type  Musculoskeletal pain     ED Discharge Orders        Ordered    cyclobenzaprine (FLEXERIL) 10 MG tablet  3 times  daily PRN     04/06/18 0201    etodolac (LODINE) 200 MG capsule  Every 8 hours     04/06/18 0201       Note:  This document was prepared using Dragon voice recognition software and may include unintentional dictation errors.    Loney Hering, MD 04/06/18 [phone]

## 2018-11-15 ENCOUNTER — Encounter: Payer: Self-pay | Admitting: Family Medicine

## 2018-12-11 ENCOUNTER — Ambulatory Visit (INDEPENDENT_AMBULATORY_CARE_PROVIDER_SITE_OTHER): Payer: BLUE CROSS/BLUE SHIELD | Admitting: Family Medicine

## 2018-12-11 ENCOUNTER — Encounter: Payer: Self-pay | Admitting: Family Medicine

## 2018-12-11 ENCOUNTER — Other Ambulatory Visit: Payer: Self-pay

## 2018-12-11 VITALS — BP 115/81 | HR 73 | Temp 99.0°F

## 2018-12-11 DIAGNOSIS — J45909 Unspecified asthma, uncomplicated: Secondary | ICD-10-CM

## 2018-12-11 MED ORDER — BUDESONIDE-FORMOTEROL FUMARATE 160-4.5 MCG/ACT IN AERO: 2.0000 | INHALATION_SPRAY | Freq: Two times a day (BID) | RESPIRATORY_TRACT | 4 refills | Status: DC

## 2018-12-11 MED ORDER — MONTELUKAST SODIUM 10 MG PO TABS: 10.0000 mg | ORAL_TABLET | Freq: Every day | ORAL | 3 refills | Status: DC

## 2018-12-11 MED ORDER — CETIRIZINE HCL 10 MG PO TABS: 10.0000 mg | ORAL_TABLET | Freq: Every day | ORAL | 11 refills | Status: DC

## 2018-12-11 MED ORDER — ALBUTEROL SULFATE HFA 108 (90 BASE) MCG/ACT IN AERS: 2.0000 | INHALATION_SPRAY | Freq: Four times a day (QID) | RESPIRATORY_TRACT | 12 refills | Status: DC | PRN

## 2018-12-11 NOTE — Progress Notes (Signed)
BP 115/81 (BP Location: Left Arm, Patient Position: Sitting, Cuff Size: Normal)   Pulse 73   Temp 99 F (37.2 C)   SpO2 96%    Subjective:    Patient ID: Roger Reynolds, male    DOB: 1976-05-18, 43 y.o.   MRN: [id number]  HPI: Roger Reynolds is a 43 y.o. male  Chief Complaint  Patient presents with  . Cough    X 2 days, patient states that he has allergy symptoms also.   ALLERGIES- stopped taking his singulair several months ago. Has been having a lot of coughing.  Duration: 2 days Runny nose: no  Nasal congestion: no Nasal itching: no Sneezing: no Eye swelling, itching or discharge: no Post nasal drip: no Cough: yes Sinus pressure: no  Ear pain: no  Ear pressure: no  Fever: no  Symptoms occur seasonally: yes Symptoms occur perenially: no Satisfied with current treatment: no Allergist evaluation in past: no Allergen injection immunotherapy: no Recurrent sinus infections: no ENT evaluation in past: no Known environmental allergy: yes Indoor pets: no History of asthma: yes Current allergy medications: zyrtec Treatments attempted: singulair  Relevant past medical, surgical, family and social history reviewed and updated as indicated. Interim medical history since our last visit reviewed. Allergies and medications reviewed and updated.  Review of Systems  Constitutional: Negative.   HENT: Negative.  Negative for congestion, dental problem, drooling, ear discharge, ear pain, facial swelling, hearing loss, mouth sores, nosebleeds, postnasal drip, rhinorrhea, sinus pressure, sinus pain, sneezing, sore throat, tinnitus, trouble swallowing and voice change.   Eyes: Negative.   Respiratory: Positive for cough. Negative for apnea, choking, chest tightness, shortness of breath, wheezing and stridor.   Cardiovascular: Negative.   Gastrointestinal: Negative.   Neurological: Negative.   Psychiatric/Behavioral: Negative.     Per HPI unless specifically indicated above      Objective:    BP 115/81 (BP Location: Left Arm, Patient Position: Sitting, Cuff Size: Normal)   Pulse 73   Temp 99 F (37.2 C)   SpO2 96%   Wt Readings from Last 3 Encounters:  04/05/18 170 lb (77.1 kg)  02/22/18 178 lb 7 oz (80.9 kg)  12/04/16 168 lb (76.2 kg)    Physical Exam Vitals signs and nursing note reviewed.  Constitutional:      General: He is not in acute distress.    Appearance: Normal appearance. He is not ill-appearing, toxic-appearing or diaphoretic.  HENT:     Head: Normocephalic and atraumatic.     Right Ear: Tympanic membrane, ear canal and external ear normal. There is no impacted cerumen.     Left Ear: Tympanic membrane, ear canal and external ear normal. There is no impacted cerumen.     Nose: Nose normal. No congestion.     Mouth/Throat:     Mouth: Mucous membranes are moist.     Pharynx: Oropharynx is clear. No oropharyngeal exudate or posterior oropharyngeal erythema.  Eyes:     General: No scleral icterus.       Right eye: No discharge.        Left eye: No discharge.     Extraocular Movements: Extraocular movements intact.     Conjunctiva/sclera: Conjunctivae normal.     Pupils: Pupils are equal, round, and reactive to light.  Neck:     Musculoskeletal: Normal range of motion and neck supple.  Cardiovascular:     Rate and Rhythm: Normal rate and regular rhythm.     Pulses: Normal pulses.  Heart sounds: Normal heart sounds. No murmur. No friction rub. No gallop.   Pulmonary:     Effort: Pulmonary effort is normal. No respiratory distress.     Breath sounds: Normal breath sounds. No stridor. No wheezing, rhonchi or rales.  Chest:     Chest wall: No tenderness.  Musculoskeletal: Normal range of motion.  Skin:    General: Skin is warm and dry.     Capillary Refill: Capillary refill takes less than 2 seconds.     Coloration: Skin is not jaundiced or pale.     Findings: No bruising, erythema, lesion or rash.  Neurological:     General: No focal  deficit present.     Mental Status: He is alert and oriented to person, place, and time. Mental status is at baseline.  Psychiatric:        Mood and Affect: Mood normal.        Behavior: Behavior normal.        Thought Content: Thought content normal.        Judgment: Judgment normal.     Results for orders placed or performed in visit on 02/22/18  CBC with Differential/Platelet  Result Value Ref Range   WBC 8.5 3.4 - 10.8 x10E3/uL   RBC 4.99 4.14 - 5.80 x10E6/uL   Hemoglobin 15.2 13.0 - 17.7 g/dL   Hematocrit 42.7 37.5 - 51.0 %   MCV 86 79 - 97 fL   MCH 30.5 26.6 - 33.0 pg   MCHC 35.6 31.5 - 35.7 g/dL   RDW 14.1 12.3 - 15.4 %   Platelets 243 150 - 450 x10E3/uL   Neutrophils 50 Not Estab. %   Lymphs 38 Not Estab. %   Monocytes 8 Not Estab. %   Eos 4 Not Estab. %   Basos 0 Not Estab. %   Neutrophils Absolute 4.3 1.4 - 7.0 x10E3/uL   Lymphocytes Absolute 3.2 (H) 0.7 - 3.1 x10E3/uL   Monocytes Absolute 0.7 0.1 - 0.9 x10E3/uL   EOS (ABSOLUTE) 0.3 0.0 - 0.4 x10E3/uL   Basophils Absolute 0.0 0.0 - 0.2 x10E3/uL   Immature Granulocytes 0 Not Estab. %   Immature Grans (Abs) 0.0 0.0 - 0.1 x10E3/uL  Comprehensive metabolic panel  Result Value Ref Range   Glucose 116 (H) 65 - 99 mg/dL   BUN 17 6 - 24 mg/dL   Creatinine, Ser 0.95 0.76 - 1.27 mg/dL   GFR calc non Af Amer 98 >59 mL/min/1.73   GFR calc Af Amer 113 >59 mL/min/1.73   BUN/Creatinine Ratio 18 9 - 20   Sodium 141 134 - 144 mmol/L   Potassium 3.9 3.5 - 5.2 mmol/L   Chloride 105 96 - 106 mmol/L   CO2 23 20 - 29 mmol/L   Calcium 9.3 8.7 - 10.2 mg/dL   Total Protein 6.7 6.0 - 8.5 g/dL   Albumin 4.6 3.5 - 5.5 g/dL   Globulin, Total 2.1 1.5 - 4.5 g/dL   Albumin/Globulin Ratio 2.2 1.2 - 2.2   Bilirubin Total 0.3 0.0 - 1.2 mg/dL   Alkaline Phosphatase 84 39 - 117 IU/L   AST 28 0 - 40 IU/L   ALT 31 0 - 44 IU/L  Lipid Panel w/o Chol/HDL Ratio  Result Value Ref Range   Cholesterol, Total 175 100 - 199 mg/dL   Triglycerides  184 (H) 0 - 149 mg/dL   HDL 51 >39 mg/dL   VLDL Cholesterol Cal 37 5 - 40 mg/dL   LDL Calculated 87 0 -  99 mg/dL  TSH  Result Value Ref Range   TSH 1.320 0.450 - 4.500 uIU/mL  UA/M w/rflx Culture, Routine  Result Value Ref Range   Specific Gravity, UA 1.015 1.005 - 1.030   pH, UA 7.0 5.0 - 7.5   Color, UA Yellow Yellow   Appearance Ur Clear Clear   Leukocytes, UA Negative Negative   Protein, UA Negative Negative/Trace   Glucose, UA Negative Negative   Ketones, UA Negative Negative   RBC, UA Negative Negative   Bilirubin, UA Negative Negative   Urobilinogen, Ur 0.2 0.2 - 1.0 mg/dL   Nitrite, UA Negative Negative  HIV antibody  Result Value Ref Range   HIV Screen 4th Generation wRfx Non Reactive Non Reactive      Assessment & Plan:   Problem List Items Addressed This Visit      Respiratory   Asthma - Primary    Known allergic asthma. Will restart his allergy medicine. Out of work for 2 days until feeling better. Call with any concerns.       Relevant Medications   budesonide-formoterol (SYMBICORT) 160-4.5 MCG/ACT inhaler   albuterol (PROVENTIL HFA;VENTOLIN HFA) 108 (90 Base) MCG/ACT inhaler   montelukast (SINGULAIR) 10 MG tablet       Follow up plan: Return May Physical.

## 2018-12-11 NOTE — Assessment & Plan Note (Signed)
Known allergic asthma. Will restart his allergy medicine. Out of work for 2 days until feeling better. Call with any concerns.

## 2019-01-10 ENCOUNTER — Other Ambulatory Visit: Payer: Self-pay | Admitting: Family Medicine

## 2019-02-28 ENCOUNTER — Encounter: Payer: Self-pay | Admitting: Family Medicine

## 2019-03-07 ENCOUNTER — Encounter: Payer: Self-pay | Admitting: Family Medicine

## 2020-01-26 ENCOUNTER — Other Ambulatory Visit: Payer: Self-pay | Admitting: Family Medicine

## 2020-01-26 NOTE — Telephone Encounter (Signed)
Requested Prescriptions  Pending Prescriptions Disp Refills  . albuterol (VENTOLIN HFA) 108 (90 Base) MCG/ACT inhaler [Pharmacy Med Name: ALBUTEROL HFA (PROAIR) INHALER] 18 g 0    Sig: TAKE 2 PUFFS BY MOUTH EVERY 6 HOURS AS NEEDED FOR WHEEZE OR SHORTNESS OF BREATH     Pulmonology:  Beta Agonists Failed - 01/26/2020  7:24 PM      Failed - One inhaler should last at least one month. If the patient is requesting refills earlier, contact the patient to check for uncontrolled symptoms.      Failed - Valid encounter within last 12 months    Recent Outpatient Visits          1 year ago Mild asthma without complication, unspecified whether persistent   [place], Megan P, DO   1 year ago Routine general medical examination at a health care facility   [place], [place], [place]   3 years ago Annual physical exam   [facility] [place], [place]   3 years ago Mild asthma without complication, unspecified whether persistent   [facility] [place], [place], [place]   4 years ago Asthma, moderate persistent, uncomplicated   [facility], Megan P, DO             . cetirizine (ZYRTEC) 10 MG tablet [Pharmacy Med Name: CETIRIZINE HCL 10 MG TABLET] 30 tablet 11    Sig: TAKE 1 TABLET BY MOUTH EVERY DAY     Ear, Nose, and Throat:  Antihistamines Failed - 01/26/2020  7:24 PM      Failed - Valid encounter within last 12 months    Recent Outpatient Visits          1 year ago Mild asthma without complication, unspecified whether persistent   [place], Megan P, DO   1 year ago Routine general medical examination at a health care facility   [place], [place], DO   3 years ago Annual physical exam   [facility] [place], [place]   3 years ago Mild asthma without complication, unspecified whether persistent   [facility], [place], [place]   4 years ago Asthma, moderate persistent, uncomplicated   [facility], [place], [place]

## 2020-02-03 ENCOUNTER — Encounter: Payer: Self-pay | Admitting: Family Medicine

## 2020-02-03 ENCOUNTER — Telehealth: Payer: BC Managed Care – PPO | Admitting: Family Medicine

## 2020-02-03 DIAGNOSIS — J4541 Moderate persistent asthma with (acute) exacerbation: Secondary | ICD-10-CM | POA: Diagnosis not present

## 2020-02-03 DIAGNOSIS — Z20828 Contact with and (suspected) exposure to other viral communicable diseases: Secondary | ICD-10-CM | POA: Diagnosis not present

## 2020-02-03 DIAGNOSIS — Z03818 Encounter for observation for suspected exposure to other biological agents ruled out: Secondary | ICD-10-CM | POA: Diagnosis not present

## 2020-02-03 MED ORDER — PREDNISONE 10 MG PO TABS: ORAL_TABLET | ORAL | 0 refills | Status: DC

## 2020-02-03 MED ORDER — BUDESONIDE-FORMOTEROL FUMARATE 160-4.5 MCG/ACT IN AERO: 2.0000 | INHALATION_SPRAY | Freq: Two times a day (BID) | RESPIRATORY_TRACT | 4 refills | Status: DC

## 2020-02-03 MED ORDER — ALBUTEROL SULFATE HFA 108 (90 BASE) MCG/ACT IN AERS: INHALATION_SPRAY | RESPIRATORY_TRACT | 0 refills | Status: DC

## 2020-02-03 NOTE — Progress Notes (Signed)
There were no vitals taken for this visit.   Subjective:    Patient ID: Roger Reynolds, male    DOB: 1976-04-30, 44 y.o.   MRN: [id number]  HPI: Roger Reynolds is a 44 y.o. male seen today through the aide of an interpretor  Chief Complaint  Patient presents with  . Asthma  . Cough   UPPER RESPIRATORY TRACT INFECTION- had a negative covid test this AM. Has not had his inhalers Duration: 3 days Worst symptom: SOB and asthma Fever: no Cough: yes Shortness of breath: yes Wheezing: yes Chest pain: yes- in the AM Chest tightness: no Chest congestion: no Nasal congestion: yes Runny nose: no Post nasal drip: no Sneezing: yes Sore throat: no Swollen glands: no Sinus pressure: no Headache: no Face pain: no Toothache: no Ear pain: no  Ear pressure: no  Eyes red/itching:no Eye drainage/crusting: no  Vomiting: no Rash: no Fatigue: yes Sick contacts: no Strep contacts: no  Context: worse Recurrent sinusitis: no Relief with OTC cold/cough medications: no  Treatments attempted: zyrtec and singulair   Relevant past medical, surgical, family and social history reviewed and updated as indicated. Interim medical history since our last visit reviewed. Allergies and medications reviewed and updated.  Review of Systems  Constitutional: Positive for fatigue. Negative for activity change, appetite change, chills, diaphoresis, fever and unexpected weight change.  HENT: Positive for congestion, nosebleeds, postnasal drip and rhinorrhea. Negative for dental problem, drooling, ear discharge, ear pain, facial swelling, hearing loss, mouth sores, sinus pressure, sinus pain, sneezing, sore throat, tinnitus, trouble swallowing and voice change.   Respiratory: Positive for cough, chest tightness, shortness of breath and wheezing. Negative for apnea, choking and stridor.   Cardiovascular: Negative.   Gastrointestinal: Negative.     Per HPI unless specifically indicated above     Objective:    There were no vitals taken for this visit.  Wt Readings from Last 3 Encounters:  04/05/18 170 lb (77.1 kg)  02/22/18 178 lb 7 oz (80.9 kg)  12/04/16 168 lb (76.2 kg)    Physical Exam Vitals and nursing note reviewed.  Constitutional:      General: He is not in acute distress.    Appearance: Normal appearance. He is not ill-appearing, toxic-appearing or diaphoretic.  HENT:     Head: Normocephalic and atraumatic.     Right Ear: External ear normal.     Left Ear: External ear normal.     Nose: Nose normal.     Mouth/Throat:     Mouth: Mucous membranes are moist.     Pharynx: Oropharynx is clear.  Eyes:     General: No scleral icterus.       Right eye: No discharge.        Left eye: No discharge.     Conjunctiva/sclera: Conjunctivae normal.     Pupils: Pupils are equal, round, and reactive to light.  Pulmonary:     Effort: Pulmonary effort is normal. No respiratory distress.     Comments: Speaking in full sentences Musculoskeletal:        General: Normal range of motion.     Cervical back: Normal range of motion.  Skin:    Coloration: Skin is not jaundiced or pale.     Findings: No bruising, erythema, lesion or rash.  Neurological:     Mental Status: He is alert and oriented to person, place, and time. Mental status is at baseline.  Psychiatric:        Mood and Affect: Mood  normal.        Behavior: Behavior normal.        Thought Content: Thought content normal.        Judgment: Judgment normal.     Results for orders placed or performed in visit on 02/22/18  CBC with Differential/Platelet  Result Value Ref Range   WBC 8.5 3.4 - 10.8 x10E3/uL   RBC 4.99 4.14 - 5.80 x10E6/uL   Hemoglobin 15.2 13.0 - 17.7 g/dL   Hematocrit 42.7 37.5 - 51.0 %   MCV 86 79 - 97 fL   MCH 30.5 26.6 - 33.0 pg   MCHC 35.6 31.5 - 35.7 g/dL   RDW 14.1 12.3 - 15.4 %   Platelets 243 150 - 450 x10E3/uL   Neutrophils 50 Not Estab. %   Lymphs 38 Not Estab. %   Monocytes 8 Not Estab. %   Eos 4  Not Estab. %   Basos 0 Not Estab. %   Neutrophils Absolute 4.3 1.4 - 7.0 x10E3/uL   Lymphocytes Absolute 3.2 (H) 0.7 - 3.1 x10E3/uL   Monocytes Absolute 0.7 0.1 - 0.9 x10E3/uL   EOS (ABSOLUTE) 0.3 0.0 - 0.4 x10E3/uL   Basophils Absolute 0.0 0.0 - 0.2 x10E3/uL   Immature Granulocytes 0 Not Estab. %   Immature Grans (Abs) 0.0 0.0 - 0.1 x10E3/uL  Comprehensive metabolic panel  Result Value Ref Range   Glucose 116 (H) 65 - 99 mg/dL   BUN 17 6 - 24 mg/dL   Creatinine, Ser 0.95 0.76 - 1.27 mg/dL   GFR calc non Af Amer 98 >59 mL/min/1.73   GFR calc Af Amer 113 >59 mL/min/1.73   BUN/Creatinine Ratio 18 9 - 20   Sodium 141 134 - 144 mmol/L   Potassium 3.9 3.5 - 5.2 mmol/L   Chloride 105 96 - 106 mmol/L   CO2 23 20 - 29 mmol/L   Calcium 9.3 8.7 - 10.2 mg/dL   Total Protein 6.7 6.0 - 8.5 g/dL   Albumin 4.6 3.5 - 5.5 g/dL   Globulin, Total 2.1 1.5 - 4.5 g/dL   Albumin/Globulin Ratio 2.2 1.2 - 2.2   Bilirubin Total 0.3 0.0 - 1.2 mg/dL   Alkaline Phosphatase 84 39 - 117 IU/L   AST 28 0 - 40 IU/L   ALT 31 0 - 44 IU/L  Lipid Panel w/o Chol/HDL Ratio  Result Value Ref Range   Cholesterol, Total 175 100 - 199 mg/dL   Triglycerides 184 (H) 0 - 149 mg/dL   HDL 51 >39 mg/dL   VLDL Cholesterol Cal 37 5 - 40 mg/dL   LDL Calculated 87 0 - 99 mg/dL  TSH  Result Value Ref Range   TSH 1.320 0.450 - 4.500 uIU/mL  UA/M w/rflx Culture, Routine   Specimen: Urine   URINE  Result Value Ref Range   Specific Gravity, UA 1.015 1.005 - 1.030   pH, UA 7.0 5.0 - 7.5   Color, UA Yellow Yellow   Appearance Ur Clear Clear   Leukocytes, UA Negative Negative   Protein, UA Negative Negative/Trace   Glucose, UA Negative Negative   Ketones, UA Negative Negative   RBC, UA Negative Negative   Bilirubin, UA Negative Negative   Urobilinogen, Ur 0.2 0.2 - 1.0 mg/dL   Nitrite, UA Negative Negative  HIV antibody  Result Value Ref Range   HIV Screen 4th Generation wRfx Non Reactive Non Reactive      Assessment  & Plan:   Problem List Items Addressed This Visit  None    Visit Diagnoses    Moderate persistent asthma with exacerbation    -  Primary   Will treat with burst of prednisone and get him restarted on his inhalers. Call with any concerns or if not getting better. Out of work for 2 days.    Relevant Medications   budesonide-formoterol (SYMBICORT) 160-4.5 MCG/ACT inhaler   albuterol (VENTOLIN HFA) 108 (90 Base) MCG/ACT inhaler   predniSONE (DELTASONE) 10 MG tablet       Follow up plan: Return in about 4 weeks (around 03/02/2020) for Physical.     . This visit was completed via MyChart due to the restrictions of the COVID-19 pandemic. All issues as above were discussed and addressed. Physical exam was done as above through visual confirmation on MyChart. If it was felt that the patient should be evaluated in the office, they were directed there. The patient verbally consented to this visit. . Location of the patient: parking lot . Location of the provider: work . Those involved with this call:  . Provider: Park Liter, DO . CMA: Lauretta Grill, RMA . Front Desk/Registration: Don Perking  . Time spent on call: 15 minutes with patient face to face via video conference. More than 50% of this time was spent in counseling and coordination of care. 23 minutes total spent in review of patient's record and preparation of their chart.

## 2020-02-04 ENCOUNTER — Telehealth: Payer: Self-pay | Admitting: Family Medicine

## 2020-02-04 NOTE — Telephone Encounter (Signed)
lvm to make this apt.  

## 2020-02-04 NOTE — Telephone Encounter (Signed)
-----   Message from Valerie Roys, [place] sent at 02/03/2020  2:00 PM EDT ----- 4 weeks physical

## 2020-02-04 NOTE — Telephone Encounter (Signed)
CPE scheduled for 6.7.21

## 2020-02-20 ENCOUNTER — Other Ambulatory Visit: Payer: Self-pay | Admitting: Family Medicine

## 2020-02-27 ENCOUNTER — Other Ambulatory Visit: Payer: Self-pay | Admitting: Family Medicine

## 2020-02-27 NOTE — Telephone Encounter (Signed)
Last filled 01/26/2020 for 30 tablets with 0 refills.

## 2020-02-27 NOTE — Telephone Encounter (Signed)
Requested medication (s) are due for refill today: yes  Requested medication (s) are on the active medication list: yes  Last refill:  01/26/2020  Future visit scheduled: yes  Notes to clinic:  patient has appointment on 03/01/2020 Looks like it was refused on 02/20/2020 Review     Requested Prescriptions  Pending Prescriptions Disp Refills   cetirizine (ZYRTEC) 10 MG tablet [Pharmacy Med Name: CETIRIZINE HCL 10 MG TABLET] 30 tablet 0    Sig: TAKE 1 TABLET BY MOUTH EVERY DAY      Ear, Nose, and Throat:  Antihistamines Passed - 02/27/2020  9:06 AM      Passed - Valid encounter within last 12 months    Recent Outpatient Visits           3 weeks ago Moderate persistent asthma with exacerbation   [place], Megan P, DO   1 year ago Mild asthma without complication, unspecified whether persistent   [place], Megan P, DO   2 years ago Routine general medical examination at a health care facility   [place], [place], DO   3 years ago Annual physical exam   [facility] [place], [place]   3 years ago Mild asthma without complication, unspecified whether persistent   [place], [place], [place]

## 2020-03-01 ENCOUNTER — Encounter: Payer: BC Managed Care – PPO | Admitting: Family Medicine

## 2020-03-05 ENCOUNTER — Ambulatory Visit (INDEPENDENT_AMBULATORY_CARE_PROVIDER_SITE_OTHER): Payer: BC Managed Care – PPO | Admitting: Unknown Physician Specialty

## 2020-03-05 ENCOUNTER — Other Ambulatory Visit: Payer: Self-pay

## 2020-03-05 ENCOUNTER — Encounter: Payer: Self-pay | Admitting: Unknown Physician Specialty

## 2020-03-05 VITALS — BP 111/69 | HR 58 | Temp 98.3°F | Ht 63.4 in | Wt 173.2 lb

## 2020-03-05 DIAGNOSIS — Z1211 Encounter for screening for malignant neoplasm of colon: Secondary | ICD-10-CM

## 2020-03-05 DIAGNOSIS — Z2829 Immunization not carried out because of patient decision for other reason: Secondary | ICD-10-CM | POA: Diagnosis not present

## 2020-03-05 DIAGNOSIS — Z23 Encounter for immunization: Secondary | ICD-10-CM

## 2020-03-05 DIAGNOSIS — Z0000 Encounter for general adult medical examination without abnormal findings: Secondary | ICD-10-CM

## 2020-03-05 DIAGNOSIS — E669 Obesity, unspecified: Secondary | ICD-10-CM | POA: Insufficient documentation

## 2020-03-05 NOTE — Patient Instructions (Addendum)
Covid vaccine hesitancy [email or web address]   Health Maintenance, Male Adopting a healthy lifestyle and getting preventive care are important in promoting health and wellness. Ask your health care provider about:  The right schedule for you to have regular tests and exams.  Things you can do on your own to prevent diseases and keep yourself healthy. What should I know about diet, weight, and exercise? Eat a healthy diet   Eat a diet that includes plenty of vegetables, fruits, low-fat dairy products, and lean protein.  Do not eat a lot of foods that are high in solid fats, added sugars, or sodium. Maintain a healthy weight Body mass index (BMI) is a measurement that can be used to identify possible weight problems. It estimates body fat based on height and weight. Your health care provider can help determine your BMI and help you achieve or maintain a healthy weight. Get regular exercise Get regular exercise. This is one of the most important things you can do for your health. Most adults should:  Exercise for at least 150 minutes each week. The exercise should increase your heart rate and make you sweat (moderate-intensity exercise).  Do strengthening exercises at least twice a week. This is in addition to the moderate-intensity exercise.  Spend less time sitting. Even light physical activity can be beneficial. Watch cholesterol and blood lipids Have your blood tested for lipids and cholesterol at 46 years of age, then have this test every 5 years. You may need to have your cholesterol levels checked more often if:  Your lipid or cholesterol levels are high.  You are older than 46 years of age.  You are at high risk for heart disease. What should I know about cancer screening? Many types of cancers can be detected early and may often be prevented. Depending on your  health history and family history, you may need to have cancer screening at various ages. This may include screening for:  Colorectal cancer.  Prostate cancer.  Skin cancer.  Lung cancer. What should I know about heart disease, diabetes, and high blood pressure? Blood pressure and heart disease  High blood pressure causes heart disease and increases the risk of stroke. This is more likely to develop in people who have high blood pressure readings, are of African descent, or are overweight.  Talk with your health care provider about your target blood pressure readings.  Have your blood pressure checked: ? Every 3-5 years if you are 64-65 years of age. ? Every year if you are 38 years old or older.  If you are between the ages of 50 and 7 and are a current or former smoker, ask your health care provider if you should have a one-time screening for abdominal aortic aneurysm (AAA). Diabetes Have regular diabetes screenings. This checks your fasting blood sugar level. Have the screening done:  Once every three years after age 40 if you are at a normal weight and have a low risk for diabetes.  More often and at a younger age if you are overweight or have a high risk for diabetes. What should I know about preventing infection? Hepatitis B If you have a higher risk for hepatitis B, you should be screened for this virus. Talk with your health care provider to find out if you are at risk for hepatitis B infection. Hepatitis C Blood testing is recommended for:  Everyone born from [birth year] through 1967.  Anyone with known risk factors for hepatitis C. Sexually transmitted infections (STIs)  You should be screened each year for STIs, including gonorrhea and chlamydia, if: ? You are sexually active and are younger than 46 years of age. ? You are older than 46 years of age and your health care provider tells you that you are at risk for this type of infection. ? Your sexual activity has changed  since you were last screened, and you are at increased risk for chlamydia or gonorrhea. Ask your health care provider if you are at risk.  Ask your health care provider about whether you are at high risk for HIV. Your health care provider may recommend a prescription medicine to help prevent HIV infection. If you choose to take medicine to prevent HIV, you should first get tested for HIV. You should then be tested every 3 months for as long as you are taking the medicine. Follow these instructions at home: Lifestyle  Do not use any products that contain nicotine or tobacco, such as cigarettes, e-cigarettes, and chewing tobacco. If you need help quitting, ask your health care provider.  Do not use street drugs.  Do not share needles.  Ask your health care provider for help if you need support or information about quitting drugs. Alcohol use  Do not drink alcohol if your health care provider tells you not to drink.  If you drink alcohol: ? Limit how much you have to 0-2 drinks a day. ? Be aware of how much alcohol is in your drink. In the [place] one drink equals one 12 oz bottle of beer (355 mL), one 5 oz glass of wine (148 mL), or one 1 oz glass of hard liquor (44 mL). General instructions  Schedule regular health, dental, and eye exams.  Stay current with your vaccines.  Tell your health care provider if: ? You often feel depressed. ? You have ever been abused or do not feel safe at home. Summary  Adopting a healthy lifestyle and getting preventive care are important in promoting health and wellness.  Follow your health care provider's instructions about healthy diet, exercising, and getting tested or screened for diseases.  Follow your health care provider's instructions on monitoring your cholesterol and blood pressure. This information is not intended to replace advice given to you by your health care provider. Make sure you discuss any questions you have with your health care  provider. Document Revised: 09/04/2018 Document Reviewed: 09/04/2018 Elsevier Patient Education  2020 [employer].

## 2020-03-05 NOTE — Progress Notes (Signed)
BP 111/69   Pulse (!) 58   Temp 98.3 F (36.8 C) (Oral)   Ht 5' 3.4" (1.61 m)   Wt 173 lb 3.2 oz (78.6 kg)   SpO2 98%   BMI 30.30 kg/m    Subjective:    Patient ID: Roger Reynolds, male    DOB: 1976/01/25, 44 y.o.   MRN: [id number]  HPI: Roger Reynolds is a 44 y.o. male  Chief Complaint  Patient presents with  . Annual Exam   Pt on no medications besides OTC Zyrtec.    Depression screen [facility] 2/9 03/05/2020 02/03/2020 02/26/2018 02/22/2018 12/04/2016  Decreased Interest 0 0 0 0 0  Down, Depressed, Hopeless 0 1 0 0 0  PHQ - 2 Score 0 1 0 0 0  Altered sleeping - - 0 0 -  Tired, decreased energy - - 0 0 -  Change in appetite - - 0 0 -  Feeling bad or failure about yourself  - - 0 0 -  Trouble concentrating - - 0 0 -  Moving slowly or fidgety/restless - - 0 0 -  Suicidal thoughts - - 0 0 -  PHQ-9 Score - - 0 0 -  Difficult doing work/chores - - Not difficult at all Not difficult at all -     Past Medical History:  Diagnosis Date  . Asthma    Family History  Problem Relation Age of Onset  . Stroke Mother   . Alcohol abuse Father    Social History   Socioeconomic History  . Marital status: Married    Spouse name: Not on file  . Number of children: Not on file  . Years of education: Not on file  . Highest education level: Not on file  Occupational History  . Not on file  Tobacco Use  . Smoking status: Never Smoker  . Smokeless tobacco: Never Used  Vaping Use  . Vaping Use: Never used  Substance and Sexual Activity  . Alcohol use: Yes    Comment: on occasion  . Drug use: No  . Sexual activity: Yes  Other Topics Concern  . Not on file  Social History Narrative  . Not on file   Social Determinants of Health   Financial Resource Strain:   . Difficulty of Paying Living Expenses:   Food Insecurity:   . Worried About Charity fundraiser in the Last Year:   . Arboriculturist in the Last Year:   Transportation Needs:   . Film/video editor (Medical):   Marland Kitchen Lack  of Transportation (Non-Medical):   Physical Activity:   . Days of Exercise per Week:   . Minutes of Exercise per Session:   Stress:   . Feeling of Stress :   Social Connections:   . Frequency of Communication with Friends and Family:   . Frequency of Social Gatherings with Friends and Family:   . Attends Religious Services:   . Active Member of Clubs or Organizations:   . Attends Archivist Meetings:   Marland Kitchen Marital Status:   Intimate Partner Violence:   . Fear of Current or Ex-Partner:   . Emotionally Abused:   Marland Kitchen Physically Abused:   . Sexually Abused:     Relevant past medical, surgical, family and social history reviewed and updated as indicated. Interim medical history since our last visit reviewed. Allergies and medications reviewed and updated.  Review of Systems  Constitutional: Negative.   HENT: Negative.   Eyes: Negative.  Respiratory: Negative.   Cardiovascular: Negative.   Gastrointestinal: Negative.   Endocrine: Negative.   Genitourinary: Negative.   Musculoskeletal: Negative.   Skin: Negative.   Allergic/Immunologic: Negative.   Neurological: Negative.   Hematological: Negative.   Psychiatric/Behavioral: Negative.     Per HPI unless specifically indicated above     Objective:    BP 111/69   Pulse (!) 58   Temp 98.3 F (36.8 C) (Oral)   Ht 5' 3.4" (1.61 m)   Wt 173 lb 3.2 oz (78.6 kg)   SpO2 98%   BMI 30.30 kg/m   Wt Readings from Last 3 Encounters:  03/05/20 173 lb 3.2 oz (78.6 kg)  04/05/18 170 lb (77.1 kg)  02/22/18 178 lb 7 oz (80.9 kg)    Physical Exam Constitutional:      Appearance: He is well-developed.  HENT:     Head: Normocephalic.     Right Ear: Tympanic membrane, ear canal and external ear normal.     Left Ear: Tympanic membrane, ear canal and external ear normal.     Mouth/Throat:     Pharynx: Uvula midline.  Eyes:     Pupils: Pupils are equal, round, and reactive to light.  Cardiovascular:     Rate and Rhythm:  Normal rate and regular rhythm.     Heart sounds: Normal heart sounds. No murmur heard.  No friction rub. No gallop.   Pulmonary:     Effort: Pulmonary effort is normal. No respiratory distress.     Breath sounds: Normal breath sounds.  Abdominal:     General: Bowel sounds are normal. There is no distension.     Palpations: Abdomen is soft.     Tenderness: There is no abdominal tenderness.  Musculoskeletal:        General: Normal range of motion.  Skin:    General: Skin is warm and dry.  Neurological:     Mental Status: He is alert and oriented to person, place, and time.     Deep Tendon Reflexes: Reflexes are normal and symmetric.  Psychiatric:        Behavior: Behavior normal.        Thought Content: Thought content normal.        Judgment: Judgment normal.     Results for orders placed or performed in visit on 02/22/18  CBC with Differential/Platelet  Result Value Ref Range   WBC 8.5 3.4 - 10.8 x10E3/uL   RBC 4.99 4.14 - 5.80 x10E6/uL   Hemoglobin 15.2 13.0 - 17.7 g/dL   Hematocrit 42.7 37.5 - 51.0 %   MCV 86 79 - 97 fL   MCH 30.5 26.6 - 33.0 pg   MCHC 35.6 31 - 35 g/dL   RDW 14.1 12.3 - 15.4 %   Platelets 243 150 - 450 x10E3/uL   Neutrophils 50 Not Estab. %   Lymphs 38 Not Estab. %   Monocytes 8 Not Estab. %   Eos 4 Not Estab. %   Basos 0 Not Estab. %   Neutrophils Absolute 4.3 1 - 7 x10E3/uL   Lymphocytes Absolute 3.2 (H) 0 - 3 x10E3/uL   Monocytes Absolute 0.7 0 - 0 x10E3/uL   EOS (ABSOLUTE) 0.3 0.0 - 0.4 x10E3/uL   Basophils Absolute 0.0 0 - 0 x10E3/uL   Immature Granulocytes 0 Not Estab. %   Immature Grans (Abs) 0.0 0.0 - 0.1 x10E3/uL  Comprehensive metabolic panel  Result Value Ref Range   Glucose 116 (H) 65 - 99 mg/dL  BUN 17 6 - 24 mg/dL   Creatinine, Ser 0.95 0.76 - 1.27 mg/dL   GFR calc non Af Amer 98 >59 mL/min/1.73   GFR calc Af Amer 113 >59 mL/min/1.73   BUN/Creatinine Ratio 18 9 - 20   Sodium 141 134 - 144 mmol/L   Potassium 3.9 3.5 - 5.2  mmol/L   Chloride 105 96 - 106 mmol/L   CO2 23 20 - 29 mmol/L   Calcium 9.3 8.7 - 10.2 mg/dL   Total Protein 6.7 6.0 - 8.5 g/dL   Albumin 4.6 3.5 - 5.5 g/dL   Globulin, Total 2.1 1.5 - 4.5 g/dL   Albumin/Globulin Ratio 2.2 1.2 - 2.2   Bilirubin Total 0.3 0.0 - 1.2 mg/dL   Alkaline Phosphatase 84 39 - 117 IU/L   AST 28 0 - 40 IU/L   ALT 31 0 - 44 IU/L  Lipid Panel w/o Chol/HDL Ratio  Result Value Ref Range   Cholesterol, Total 175 100 - 199 mg/dL   Triglycerides 184 (H) 0 - 149 mg/dL   HDL 51 >39 mg/dL   VLDL Cholesterol Cal 37 5 - 40 mg/dL   LDL Calculated 87 0 - 99 mg/dL  TSH  Result Value Ref Range   TSH 1.320 0.450 - 4.500 uIU/mL  UA/M w/rflx Culture, Routine   Specimen: Urine   URINE  Result Value Ref Range   Specific Gravity, UA 1.015 1.005 - 1.030   pH, UA 7.0 5.0 - 7.5   Color, UA Yellow Yellow   Appearance Ur Clear Clear   Leukocytes, UA Negative Negative   Protein, UA Negative Negative/Trace   Glucose, UA Negative Negative   Ketones, UA Negative Negative   RBC, UA Negative Negative   Bilirubin, UA Negative Negative   Urobilinogen, Ur 0.2 0.2 - 1.0 mg/dL   Nitrite, UA Negative Negative  HIV antibody  Result Value Ref Range   HIV Screen 4th Generation wRfx Non Reactive Non Reactive      Assessment & Plan:   Problem List Items Addressed This Visit      Unprioritized   Obesity (BMI 30-39.9)    Discussed diet and exercise.   Written information given      Relevant Orders   Hgb A1c w/o eAG   RESOLVED: Refusal of influenza vaccine by provider    Other Visit Diagnoses    Need for Tdap vaccination    -  Primary   Relevant Orders   Tdap vaccine greater than or equal to 7yo IM   Annual physical exam       Relevant Orders   CBC with Differential/Platelet   Comprehensive metabolic panel   Lipid Panel w/o Chol/HDL Ratio   TSH   PSA   Colon cancer screening       Starting at 69 with new guidelines.  Will refer to GI   Relevant Orders   Ambulatory  referral to Gastroenterology      Covid vaccine hesitancy.  Written CDC guidelines given.    Follow up plan:  Follow up 1 year for physical

## 2020-03-05 NOTE — Assessment & Plan Note (Signed)
Discussed diet and exercise.   Written information given

## 2020-03-06 LAB — COMPREHENSIVE METABOLIC PANEL
ALT: 19 IU/L (ref 0–44)
AST: 19 IU/L (ref 0–40)
Albumin/Globulin Ratio: 1.9 (ref 1.2–2.2)
Albumin: 4.3 g/dL (ref 4.0–5.0)
Alkaline Phosphatase: 94 IU/L (ref 48–121)
BUN/Creatinine Ratio: 17 (ref 9–20)
BUN: 17 mg/dL (ref 6–24)
Bilirubin Total: 0.3 mg/dL (ref 0.0–1.2)
CO2: 23 mmol/L (ref 20–29)
Calcium: 9.3 mg/dL (ref 8.7–10.2)
Chloride: 104 mmol/L (ref 96–106)
Creatinine, Ser: 0.99 mg/dL (ref 0.76–1.27)
GFR calc Af Amer: 106 mL/min/{1.73_m2} (ref >59)
GFR calc non Af Amer: 92 mL/min/{1.73_m2} (ref >59)
Globulin, Total: 2.3 g/dL (ref 1.5–4.5)
Glucose: 81 mg/dL (ref 65–99)
Potassium: 4 mmol/L (ref 3.5–5.2)
Sodium: 140 mmol/L (ref 134–144)
Total Protein: 6.6 g/dL (ref 6.0–8.5)

## 2020-03-06 LAB — CBC WITH DIFFERENTIAL/PLATELET
Basophils Absolute: 0 10*3/uL (ref 0.0–0.2)
Basos: 1 %
EOS (ABSOLUTE): 0.1 10*3/uL (ref 0.0–0.4)
Eos: 1 %
Hematocrit: 46.3 % (ref 37.5–51.0)
Hemoglobin: 15.6 g/dL (ref 13.0–17.7)
Immature Grans (Abs): 0 10*3/uL (ref 0.0–0.1)
Immature Granulocytes: 0 %
Lymphocytes Absolute: 2.8 10*3/uL (ref 0.7–3.1)
Lymphs: 33 %
MCH: 30.1 pg (ref 26.6–33.0)
MCHC: 33.7 g/dL (ref 31.5–35.7)
MCV: 89 fL (ref 79–97)
Monocytes Absolute: 0.7 10*3/uL (ref 0.1–0.9)
Monocytes: 9 %
Neutrophils Absolute: 4.7 10*3/uL (ref 1.4–7.0)
Neutrophils: 56 %
Platelets: 280 10*3/uL (ref 150–450)
RBC: 5.18 x10E6/uL (ref 4.14–5.80)
RDW: 12.9 % (ref 11.6–15.4)
WBC: 8.4 10*3/uL (ref 3.4–10.8)

## 2020-03-06 LAB — LIPID PANEL W/O CHOL/HDL RATIO
Cholesterol, Total: 182 mg/dL (ref 100–199)
HDL: 52 mg/dL (ref >39)
LDL Chol Calc (NIH): 111 mg/dL — ABNORMAL HIGH (ref 0–99)
Triglycerides: 106 mg/dL (ref 0–149)
VLDL Cholesterol Cal: 19 mg/dL (ref 5–40)

## 2020-03-06 LAB — PSA: Prostate Specific Ag, Serum: 0.9 ng/mL (ref 0.0–4.0)

## 2020-03-06 LAB — TSH: TSH: 1.35 u[IU]/mL (ref 0.450–4.500)

## 2020-03-06 LAB — HGB A1C W/O EAG: Hgb A1c MFr Bld: 5.7 % — ABNORMAL HIGH (ref 4.8–5.6)

## 2020-03-16 ENCOUNTER — Encounter: Payer: Self-pay | Admitting: *Deleted

## 2020-09-25 DIAGNOSIS — K635 Polyp of colon: Secondary | ICD-10-CM

## 2021-01-08 ENCOUNTER — Other Ambulatory Visit: Payer: Self-pay | Admitting: Family Medicine

## 2021-01-08 NOTE — Telephone Encounter (Signed)
Requested Prescriptions  Pending Prescriptions Disp Refills  . cetirizine (ZYRTEC) 10 MG tablet [Pharmacy Med Name: CETIRIZINE HCL 10 MG TABLET] 30 tablet 0    Sig: TAKE 1 TABLET BY MOUTH EVERY DAY     Ear, Nose, and Throat:  Antihistamines Passed - 01/08/2021 10:30 AM      Passed - Valid encounter within last 12 months    Recent Outpatient Visits          10 months ago Need for Tdap vaccination   [facility] Kathrine Haddock, NP   11 months ago Moderate persistent asthma with exacerbation   [place], Megan P, DO   2 years ago Mild asthma without complication, unspecified whether persistent   [place], [place]earny, DO   2 years ago Routine general medical examination at a health care facility   [place], [place], DO   4 years ago Annual physical exam   [facility], [place]      Future Appointments            In 2 months Wynetta Emery, Barb Merino, DO [employer]

## 2021-01-24 ENCOUNTER — Encounter: Payer: Self-pay | Admitting: Nurse Practitioner

## 2021-01-24 ENCOUNTER — Ambulatory Visit: Payer: BC Managed Care – PPO | Admitting: Nurse Practitioner

## 2021-01-24 ENCOUNTER — Other Ambulatory Visit: Payer: Self-pay

## 2021-01-24 VITALS — BP 115/77 | HR 65 | Temp 98.2°F | Wt 178.6 lb

## 2021-01-24 DIAGNOSIS — G43119 Migraine with aura, intractable, without status migrainosus: Secondary | ICD-10-CM | POA: Diagnosis not present

## 2021-01-24 DIAGNOSIS — L659 Nonscarring hair loss, unspecified: Secondary | ICD-10-CM | POA: Insufficient documentation

## 2021-01-24 MED ORDER — ONDANSETRON 4 MG PO TBDP
4.0000 mg | ORAL_TABLET | Freq: Three times a day (TID) | ORAL | 0 refills | Status: DC | PRN
Start: 2021-01-24 — End: 2021-03-11

## 2021-01-24 MED ORDER — KETOCONAZOLE 2 % EX SHAM: 1.0000 "application " | MEDICATED_SHAMPOO | CUTANEOUS | 1 refills | Status: DC

## 2021-01-24 NOTE — Assessment & Plan Note (Signed)
Circular patches to scalp and beard. Will treat with ketoconazole shampoo twice a week. Follow-up if symptoms don't improve or worsen.

## 2021-01-24 NOTE — Progress Notes (Signed)
Acute Office Visit  Subjective:    Patient ID: Roger Reynolds, male    DOB: 02/03/1976, 45 y.o.   MRN: [id number]  Chief Complaint  Patient presents with  . Headache    Patient states for about a year he occasionally gets a headache about every month where his eye twitches, and he feels nauseous for a few minutes.   . Hair/Scalp Problem    Patient states he has noticed areas on scalp where he is loosing hair.   Marland Kitchen Elbow Pain    Patient states for about 6 months he sometimes gets pain in his right elbow     HPI Patient is in today for a headache, hair loss, and elbow pain   HEADACHE  Does not have a history of headaches Duration: years  1 year Onset: gradual Severity: 7/10 Quality: sharp Frequency: about a once a month, last episode 2 weeks ago Location: left and right temple Headache duration: 3 hours Radiation: no Time of day headache occurs: random Alleviating factors: advil, rest, food Aggravating factors: no Headache status at time of visit: asymptomatic Treatments attempted: Treatments attempted: rest and ibuprofen   Aura: yes. He states his eye twitches first and that's how he knows the pain is coming. He sometimes also loses vision in that eye during the headache.  Nausea:  yes Vomiting: yes Photophobia:  no Phonophobia:  no Effect on social functioning:  no Numbers of missed days of school/work each month: 1 day he went home early Confusion:  no Gait disturbance/ataxia:  no Behavioral changes:  no Fevers:  no  ALOPECIA / HAIR LOSS  Duration: months 6 months Onset: gradual Location/pattern: patchy Severity: moderate Scarring:  no  Erythema:  no Scaling/flaking:  no Itching:  no Recent stressful event:  no Recent illness/surgery:  no Recent weight loss/dietary change: no Hair pulling habit:  no Hair styling with traction:  no New medications:  no Family history of hair loss:  no Status: worse Treatments attempted: oil from Derald Macleod and his hair has  started growing back   Past Medical History:  Diagnosis Date  . Asthma     No past surgical history on file.  Family History  Problem Relation Age of Onset  . Stroke Mother   . Alcohol abuse Father     Social History   Socioeconomic History  . Marital status: Married    Spouse name: Not on file  . Number of children: Not on file  . Years of education: Not on file  . Highest education level: Not on file  Occupational History  . Not on file  Tobacco Use  . Smoking status: Never Smoker  . Smokeless tobacco: Never Used  Vaping Use  . Vaping Use: Never used  Substance and Sexual Activity  . Alcohol use: Yes    Comment: on occasion  . Drug use: No  . Sexual activity: Yes  Other Topics Concern  . Not on file  Social History Narrative  . Not on file   Social Determinants of Health   Financial Resource Strain: Not on file  Food Insecurity: Not on file  Transportation Needs: Not on file  Physical Activity: Not on file  Stress: Not on file  Social Connections: Not on file  Intimate Partner Violence: Not on file    Outpatient Medications Prior to Visit  Medication Sig Dispense Refill  . cetirizine (ZYRTEC) 10 MG tablet TAKE 1 TABLET BY MOUTH EVERY DAY 30 tablet 0   No facility-administered  medications prior to visit.    No Known Allergies  Review of Systems  Constitutional: Negative.   HENT: Negative.   Eyes: Positive for visual disturbance (with headache).  Cardiovascular: Negative.   Gastrointestinal: Positive for nausea (with headache) and vomiting (with headache).  Genitourinary: Negative.   Musculoskeletal: Positive for arthralgias (right elbow, intermittent).  Skin: Negative.   Allergic/Immunologic: Positive for environmental allergies.  Neurological: Positive for headaches. Negative for weakness.  Psychiatric/Behavioral: Negative.        Objective:    Physical Exam Vitals and nursing note reviewed.  Constitutional:      Appearance: Normal  appearance.  HENT:     Head: Normocephalic.     Comments: Patchy, circular areas of hair loss noted to right and left scalp and left jaw in his beard. No rashes or scaling of skin noted.    Right Ear: Tympanic membrane, ear canal and external ear normal.     Left Ear: Tympanic membrane, ear canal and external ear normal.  Eyes:     Conjunctiva/sclera: Conjunctivae normal.  Cardiovascular:     Rate and Rhythm: Normal rate and regular rhythm.     Pulses: Normal pulses.     Heart sounds: Normal heart sounds.  Pulmonary:     Effort: Pulmonary effort is normal.     Breath sounds: Normal breath sounds.  Abdominal:     Palpations: Abdomen is soft.     Tenderness: There is no abdominal tenderness.  Musculoskeletal:        General: Normal range of motion.     Cervical back: Normal range of motion.     Comments: Strength 5/5 in bilateral upper and lower extremities   Skin:    General: Skin is warm.     Findings: No rash.  Neurological:     General: No focal deficit present.     Mental Status: He is alert and oriented to person, place, and time.     Cranial Nerves: No cranial nerve deficit.     Motor: No weakness.     Gait: Gait normal.  Psychiatric:        Mood and Affect: Mood normal.        Behavior: Behavior normal.        Thought Content: Thought content normal.        Judgment: Judgment normal.     BP 115/77   Pulse 65   Temp 98.2 F (36.8 C)   Wt 178 lb 9.6 oz (81 kg)   SpO2 97%   BMI 31.24 kg/m  Wt Readings from Last 3 Encounters:  01/24/21 178 lb 9.6 oz (81 kg)  03/05/20 173 lb 3.2 oz (78.6 kg)  04/05/18 170 lb (77.1 kg)    Health Maintenance Due  Topic Date Due  . COVID-19 Vaccine (1) Never done  . COLONOSCOPY (Pts 45-82yrs Insurance coverage will need to be confirmed)  Never done    There are no preventive care reminders to display for this patient.   Lab Results  Component Value Date   TSH 1.350 03/05/2020   Lab Results  Component Value Date   WBC  8.4 03/05/2020   HGB 15.6 03/05/2020   HCT 46.3 03/05/2020   MCV 89 03/05/2020   PLT 280 03/05/2020   Lab Results  Component Value Date   NA 140 03/05/2020   K 4.0 03/05/2020   CO2 23 03/05/2020   GLUCOSE 81 03/05/2020   BUN 17 03/05/2020   CREATININE 0.99 03/05/2020   BILITOT  0.3 03/05/2020   ALKPHOS 94 03/05/2020   AST 19 03/05/2020   ALT 19 03/05/2020   PROT 6.6 03/05/2020   ALBUMIN 4.3 03/05/2020   CALCIUM 9.3 03/05/2020   Lab Results  Component Value Date   CHOL 182 03/05/2020   Lab Results  Component Value Date   HDL 52 03/05/2020   Lab Results  Component Value Date   LDLCALC 111 (H) 03/05/2020   Lab Results  Component Value Date   TRIG 106 03/05/2020   No results found for: CHOLHDL Lab Results  Component Value Date   HGBA1C 5.7 (H) 03/05/2020       Assessment & Plan:   Problem List Items Addressed This Visit      Cardiovascular and Mediastinum   Intractable migraine with aura without status migrainosus - Primary    Has had intermittent headaches every 4-6 weeks. Neuro exam WNL today. Currently pain free. Will refer to neurology with visual disturbances during headaches. Can take ibuprofen as needed for pain. Prescription for zofran sent to pharmacy. Follow-up in 1 month.      Relevant Orders   Ambulatory referral to Neurology     Other   Hair loss    Circular patches to scalp and beard. Will treat with ketoconazole shampoo twice a week. Follow-up if symptoms don't improve or worsen.           Meds ordered this encounter  Medications  . ketoconazole (NIZORAL) 2 % shampoo    Sig: Apply 1 application topically 2 (two) times a week.    Dispense:  120 mL    Refill:  1  . ondansetron (ZOFRAN ODT) 4 MG disintegrating tablet    Sig: Take 1 tablet (4 mg total) by mouth every 8 (eight) hours as needed for nausea or vomiting.    Dispense:  20 tablet    Refill:  0     Charyl Dancer, NP

## 2021-01-24 NOTE — Assessment & Plan Note (Signed)
Has had intermittent headaches every 4-6 weeks. Neuro exam WNL today. Currently pain free. Will refer to neurology with visual disturbances during headaches. Can take ibuprofen as needed for pain. Prescription for zofran sent to pharmacy. Follow-up in 1 month.

## 2021-01-24 NOTE — Patient Instructions (Signed)
Cefalea migraosa Migraine Headache Una cefalea migraosa es un dolor muy intenso y punzante en uno o ambos lados de la cabeza. Este tipo de dolor de cabeza tambin puede causar otros sntomas. Puede durar desde 4horas hasta 3das. Hable con su mdico sobre las cosas que pueden causar (desencadenar) esta afeccin. Cules son las causas? Se desconoce la causa exacta de esta afeccin. Esta afeccin puede desencadenarse o ser causada por lo siguiente:  Consumo de alcohol.  Consumo de cigarrillos.  Tomar medicamentos como por ejemplo: ? Medicamentos para Best boy torcico (nitroglicerina). ? Anticonceptivos orales. ? Estrgeno. ? Algunos medicamentos para la presin arterial.  Comer o beber ciertos productos.  Hacer actividad fsica. Otros factores que pueden provocar cefalea migraosa son los siguientes:  Tener el perodo menstrual.  Glennis Brink.  Hambre.  Estrs.  No dormir lo suficiente o dormir demasiado.  Cambios climticos.  Cansancio (fatiga). Qu incrementa el riesgo?  Tener entre 25 y 21aos de edad.  Ser mujer.  Tener antecedentes familiares de cefalea migraosa.  Ser de Science writer.  Tener depresin o ansiedad.  Tener mucho sobrepeso. Cules son los signos o los sntomas?  Un dolor punzante. Este dolor puede [employer] siguientes caractersticas: ? Audiological scientist en cualquier regin de la cabeza, tanto de un lado como de [place]. ? Puede dificultar las actividades cotidianas. ? Puede empeorar con la actividad fsica. ? Puede empeorar con las luces brillantes o los ruidos fuertes.  Otros sntomas pueden incluir: ? Ganas de vomitar (nuseas). ? Vmitos. ? Mareos. ? Sensibilidad a las luces brillantes, los ruidos fuertes o [employer].  Antes de tener una cefalea migraosa, puede recibir seales de advertencia (aura). Un aura puede incluir: ? Ver luces intermitentes o tener puntos ciegos. ? Ver puntos brillantes, halos o lneas en  zigzag. ? Tener una visin en tnel o visin borrosa. ? Sentir entumecimiento u hormigueo. ? Tener dificultad para hablar. ? Tener msculos dbiles.  Algunas personas tienen sntomas despus de una cefalea migraosa (fase posdromal), como los siguientes: ? Cansancio. ? Dificultad para pensar (concentrarse). Cmo se trata?  Tomar medicamentos para: ? Best boy. ? Aliviar la sensacin de Higher education careers adviser. ? Prevenir las Psychologist, occupational.  El tratamiento tambin puede incluir lo siguiente: ? Tomar sesiones de acupuntura. ? Evitar los alimentos que provocan las cefaleas migraosas. ? Aprender Orland Jarred de controlar las funciones corporales (biorretroalimentacin). ? [place] para ayudarlo a Civil engineer, contracting y lidiar con los pensamientos negativos (terapia cognitivo conductual). Siga estas instrucciones en su casa: Medicamentos  [employer] de venta libre y los recetados solamente como se lo haya indicado el mdico.  Consulte a su mdico si el medicamento que le recetaron: ? Hace que sea necesario que evite conducir o usar maquinaria pesada. ? Puede causarle dificultad para defecar (estreimiento). Es posible que deba tomar estas medidas para prevenir o tratar los problemas para defecar:  Electronics engineer suficiente lquido para Contractor pis (la orina) de color amarillo plido.  Tomar medicamentos recetados o de [employer].  Comer alimentos ricos en fibra. Entre ellos, frijoles, cereales integrales y frutas y verduras frescas.  Limitar los alimentos con alto contenido de grasa y Location manager. Estos incluyen alimentos fritos o dulces. Estilo de vida  No beba alcohol.  No consuma ningn producto que contenga nicotina o tabaco, como cigarrillos, cigarrillos electrnicos y tabaco de Higher education careers adviser. Si necesita ayuda para dejar de fumar, consulte al mdico.  Duerma como mnimo 8horas todas las noches.  Limite el estrs y [place]. Indicaciones generales  Lleve  un registro diario para  Neurosurgeon lo que Financial trader. Registre, por ejemplo, lo siguiente: ? Lo que usted come y bebe. ? El tiempo que duerme. ? Algn cambio en lo que come o bebe. ? Algn cambio en sus medicamentos.  Si tiene una cefalea migraosa: ? Evite los factores que [employer] sntomas, como las luces brillantes. ? Resulta til acostarse en una habitacin oscura y silenciosa. ? No conduzca vehculos ni opere maquinaria pesada. ? Pregntele al mdico qu actividades son seguras para usted.  Concurra a todas las visitas de seguimiento como se lo haya indicado el mdico. Esto es importante.      Comunquese con un mdico si:  Tiene una cefalea migraosa que es diferente o peor que otras que ha tenido.  Tiene ms de [address] de cefalea por mes. Solicite ayuda inmediatamente si:  La cefalea migraosa [employer].  La cefalea migraosa dura ms de 72 horas.  Tiene fiebre.  Presenta rigidez en el cuello.  Tiene dificultad para ver.  Siente debilidad en los msculos o que no puede controlarlos.  Comienza a perder el equilibrio continuamente.  Comienza a tener dificultad para caminar.  Pierde el conocimiento (se desmaya).  Tiene una convulsin. Resumen  [place] cefalea migraosa es un dolor muy intenso y punzante en uno o ambos lados de la cabeza. Estos dolores de [place] tambin pueden causar otros sntomas.  Esta afeccin puede tratarse con medicamentos y cambios en el estilo de vida.  Lleve un registro diario para Neurosurgeon lo que Financial trader.  Comunquese con un mdico si tiene una cefalea migraosa que es diferente o peor que otras que ha tenido.  Comunquese con el mdico si tiene ms de 15 das de cefalea en un mes. Esta informacin no tiene Marine scientist el consejo del mdico. Asegrese de hacerle al mdico cualquier pregunta que tenga. Document Revised: 11/22/2018 Document Reviewed: 11/22/2018 Elsevier Patient Education   2021 [employer].

## 2021-01-26 ENCOUNTER — Telehealth: Payer: Self-pay

## 2021-01-26 NOTE — Telephone Encounter (Signed)
Copied from [place] [id number]. Topic: General - Other >> Jan 26, 2021  1:01 PM Yvette Rack wrote: Reason for CRM: Pt stated he went to [facility] for his prescriptions but he was told that they have not received any prescriptions. Pt asked if the prescriptions for ketoconazole (NIZORAL) 2 % shampoo and ondansetron (ZOFRAN ODT) 4 MG disintegrating tablet be sent to [id number] - [place], [place] - [address]

## 2021-01-26 NOTE — Telephone Encounter (Signed)
Spoke to pharmacy, they stated prescriptions are there but they do not have patient insurance information so they have not filled the prescriptions. Patient aware to give pharmacy insurance information.

## 2021-02-02 ENCOUNTER — Other Ambulatory Visit: Payer: Self-pay | Admitting: Family Medicine

## 2021-02-18 ENCOUNTER — Other Ambulatory Visit: Payer: Self-pay | Admitting: Family Medicine

## 2021-02-23 ENCOUNTER — Other Ambulatory Visit: Payer: Self-pay | Admitting: Family Medicine

## 2021-02-23 NOTE — Telephone Encounter (Signed)
Requested medications are due for refill today.  unknown  Requested medications are on the active medications list.  no  Last refill. unknown  Future visit scheduled.   no  Notes to clinic.  Please advise.

## 2021-02-24 NOTE — Telephone Encounter (Signed)
Scheduled 6/17

## 2021-03-02 ENCOUNTER — Other Ambulatory Visit: Payer: Self-pay | Admitting: Family Medicine

## 2021-03-11 ENCOUNTER — Ambulatory Visit (INDEPENDENT_AMBULATORY_CARE_PROVIDER_SITE_OTHER): Payer: BC Managed Care – PPO | Admitting: Family Medicine

## 2021-03-11 ENCOUNTER — Other Ambulatory Visit: Payer: Self-pay

## 2021-03-11 ENCOUNTER — Encounter: Payer: Self-pay | Admitting: Family Medicine

## 2021-03-11 VITALS — BP 114/76 | HR 58 | Temp 98.2°F | Ht 64.0 in | Wt 181.2 lb

## 2021-03-11 DIAGNOSIS — Z1211 Encounter for screening for malignant neoplasm of colon: Secondary | ICD-10-CM

## 2021-03-11 DIAGNOSIS — Z0000 Encounter for general adult medical examination without abnormal findings: Secondary | ICD-10-CM

## 2021-03-11 DIAGNOSIS — Z136 Encounter for screening for cardiovascular disorders: Secondary | ICD-10-CM | POA: Diagnosis not present

## 2021-03-11 LAB — MICROSCOPIC EXAMINATION
Bacteria, UA: NONE SEEN
Epithelial Cells (non renal): NONE SEEN /hpf (ref 0–10)
RBC: NONE SEEN /hpf (ref 0–2)
WBC, UA: NONE SEEN /hpf (ref 0–5)

## 2021-03-11 LAB — URINALYSIS, ROUTINE W REFLEX MICROSCOPIC
Bilirubin, UA: NEGATIVE
Glucose, UA: NEGATIVE
Ketones, UA: NEGATIVE
Leukocytes,UA: NEGATIVE
Nitrite, UA: NEGATIVE
Protein,UA: NEGATIVE
Specific Gravity, UA: 1.02 (ref 1.005–1.030)
Urobilinogen, Ur: 0.2 mg/dL (ref 0.2–1.0)
pH, UA: 5.5 (ref 5.0–7.5)

## 2021-03-11 LAB — BAYER DCA HB A1C WAIVED: HB A1C (BAYER DCA - WAIVED): 5.8 % (ref <7.0)

## 2021-03-11 MED ORDER — BUDESONIDE-FORMOTEROL FUMARATE 160-4.5 MCG/ACT IN AERO
INHALATION_SPRAY | RESPIRATORY_TRACT | 3 refills | Status: DC
Start: 2021-03-11 — End: 2022-06-23

## 2021-03-11 MED ORDER — ALBUTEROL SULFATE HFA 108 (90 BASE) MCG/ACT IN AERS: INHALATION_SPRAY | RESPIRATORY_TRACT | 6 refills | Status: DC

## 2021-03-11 NOTE — Progress Notes (Signed)
BP 114/76   Pulse (!) 58   Temp 98.2 F (36.8 C)   Ht 5\' 4"  (1.626 m)   Wt 181 lb 3.2 oz (82.2 kg)   SpO2 97%   BMI 31.10 kg/m    Subjective:    Patient ID: Roger Reynolds, male    DOB: [birth year], 45 y.o.   MRN: [id number]  HPI: Roger Reynolds is a 45 y.o. male presenting on 03/11/2021 for comprehensive medical examination. Current medical complaints include:none  He currently lives with: wife and kids Interim Problems from his last visit: no  Depression Screen done today and results listed below:  Depression screen [facility] 2/9 03/11/2021 03/05/2020 02/03/2020 02/26/2018 02/22/2018  Decreased Interest 0 0 0 0 0  Down, Depressed, Hopeless 0 0 1 0 0  PHQ - 2 Score 0 0 1 0 0  Altered sleeping - - - 0 0  Tired, decreased energy - - - 0 0  Change in appetite - - - 0 0  Feeling bad or failure about yourself  - - - 0 0  Trouble concentrating - - - 0 0  Moving slowly or fidgety/restless - - - 0 0  Suicidal thoughts - - - 0 0  PHQ-9 Score - - - 0 0  Difficult doing work/chores - - - Not difficult at all Not difficult at all     Past Medical History:  Past Medical History:  Diagnosis Date   Asthma     Surgical History:  History reviewed. No pertinent surgical history.  Medications:  Current Outpatient Medications on File Prior to Visit  Medication Sig   cetirizine (ZYRTEC) 10 MG tablet TAKE 1 TABLET BY MOUTH EVERY DAY   ketoconazole (NIZORAL) 2 % shampoo Apply 1 application topically 2 (two) times a week.   SYMBICORT 160-4.5 MCG/ACT inhaler TAKE 2 PUFFS BY MOUTH TWICE A DAY (Patient not taking: Reported on 03/11/2021)   No current facility-administered medications on file prior to visit.    Allergies:  No Known Allergies  Social History:  Social History   Socioeconomic History   Marital status: Married    Spouse name: Not on file   Number of children: Not on file   Years of education: Not on file   Highest education level: Not on file  Occupational History   Not on file   Tobacco Use   Smoking status: Never   Smokeless tobacco: Never  Vaping Use   Vaping Use: Never used  Substance and Sexual Activity   Alcohol use: Yes    Comment: on occasion   Drug use: No   Sexual activity: Yes  Other Topics Concern   Not on file  Social History Narrative   Not on file   Social Determinants of Health   Financial Resource Strain: Not on file  Food Insecurity: Not on file  Transportation Needs: Not on file  Physical Activity: Not on file  Stress: Not on file  Social Connections: Not on file  Intimate Partner Violence: Not on file   Social History   Tobacco Use  Smoking Status Never  Smokeless Tobacco Never   Social History   Substance and Sexual Activity  Alcohol Use Yes   Comment: on occasion    Family History:  Family History  Problem Relation Age of Onset   Stroke Mother    Alcohol abuse Father     Past medical history, surgical history, medications, allergies, family history and social history reviewed with patient today and changes made to  appropriate areas of the chart.   Review of Systems  Constitutional: Negative.   HENT: Negative.    Eyes:  Positive for blurred vision (with reading). Negative for double vision, photophobia, pain, discharge and redness.  Respiratory: Negative.    Cardiovascular: Negative.   Gastrointestinal: Negative.   Genitourinary: Negative.   Musculoskeletal:  Positive for back pain. Negative for falls, joint pain, myalgias and neck pain.  Skin: Negative.   Neurological: Negative.   Endo/Heme/Allergies: Negative.   Psychiatric/Behavioral: Negative.    All other ROS negative except what is listed above and in the HPI.      Objective:    BP 114/76   Pulse (!) 58   Temp 98.2 F (36.8 C)   Ht 5\' 4"  (1.626 m)   Wt 181 lb 3.2 oz (82.2 kg)   SpO2 97%   BMI 31.10 kg/m   Wt Readings from Last 3 Encounters:  03/11/21 181 lb 3.2 oz (82.2 kg)  01/24/21 178 lb 9.6 oz (81 kg)  03/05/20 173 lb 3.2 oz (78.6  kg)    Physical Exam Vitals and nursing note reviewed.  Constitutional:      General: He is not in acute distress.    Appearance: Normal appearance. He is obese. He is not ill-appearing, toxic-appearing or diaphoretic.  HENT:     Head: Normocephalic and atraumatic.     Right Ear: Tympanic membrane, ear canal and external ear normal. There is no impacted cerumen.     Left Ear: Tympanic membrane, ear canal and external ear normal. There is no impacted cerumen.     Nose: Nose normal. No congestion or rhinorrhea.     Mouth/Throat:     Mouth: Mucous membranes are moist.     Pharynx: Oropharynx is clear. No oropharyngeal exudate or posterior oropharyngeal erythema.  Eyes:     General: No scleral icterus.       Right eye: No discharge.        Left eye: No discharge.     Extraocular Movements: Extraocular movements intact.     Conjunctiva/sclera: Conjunctivae normal.     Pupils: Pupils are equal, round, and reactive to light.  Neck:     Vascular: No carotid bruit.  Cardiovascular:     Rate and Rhythm: Normal rate and regular rhythm.     Pulses: Normal pulses.     Heart sounds: No murmur heard.   No friction rub. No gallop.  Pulmonary:     Effort: Pulmonary effort is normal. No respiratory distress.     Breath sounds: Normal breath sounds. No stridor. No wheezing, rhonchi or rales.  Chest:     Chest wall: No tenderness.  Abdominal:     General: Abdomen is flat. Bowel sounds are normal. There is no distension.     Palpations: Abdomen is soft. There is no mass.     Tenderness: There is no abdominal tenderness. There is no right CVA tenderness, left CVA tenderness, guarding or rebound.     Hernia: No hernia is present.  Genitourinary:    Comments: Genital exam deferred with shared decision making Musculoskeletal:        General: No swelling, tenderness, deformity or signs of injury.     Cervical back: Normal range of motion and neck supple. No rigidity. No muscular tenderness.      Right lower leg: No edema.     Left lower leg: No edema.  Lymphadenopathy:     Cervical: No cervical adenopathy.  Skin:  General: Skin is warm and dry.     Capillary Refill: Capillary refill takes less than 2 seconds.     Coloration: Skin is not jaundiced or pale.     Findings: No bruising, erythema, lesion or rash.  Neurological:     General: No focal deficit present.     Mental Status: He is alert and oriented to person, place, and time.     Cranial Nerves: No cranial nerve deficit.     Sensory: No sensory deficit.     Motor: No weakness.     Coordination: Coordination normal.     Gait: Gait normal.     Deep Tendon Reflexes: Reflexes normal.  Psychiatric:        Mood and Affect: Mood normal.        Behavior: Behavior normal.        Thought Content: Thought content normal.        Judgment: Judgment normal.    Results for orders placed or performed in visit on 03/05/20  CBC with Differential/Platelet  Result Value Ref Range   WBC 8.4 3.4 - 10.8 x10E3/uL   RBC 5.18 4.14 - 5.80 x10E6/uL   Hemoglobin 15.6 13.0 - 17.7 g/dL   Hematocrit 46.3 37.5 - 51.0 %   MCV 89 79 - 97 fL   MCH 30.1 26.6 - 33.0 pg   MCHC 33.7 31.5 - 35.7 g/dL   RDW 12.9 11.6 - 15.4 %   Platelets 280 150 - 450 x10E3/uL   Neutrophils 56 Not Estab. %   Lymphs 33 Not Estab. %   Monocytes 9 Not Estab. %   Eos 1 Not Estab. %   Basos 1 Not Estab. %   Neutrophils Absolute 4.7 1.4 - 7.0 x10E3/uL   Lymphocytes Absolute 2.8 0.7 - 3.1 x10E3/uL   Monocytes Absolute 0.7 0.1 - 0.9 x10E3/uL   EOS (ABSOLUTE) 0.1 0.0 - 0.4 x10E3/uL   Basophils Absolute 0.0 0.0 - 0.2 x10E3/uL   Immature Granulocytes 0 Not Estab. %   Immature Grans (Abs) 0.0 0.0 - 0.1 x10E3/uL  Comprehensive metabolic panel  Result Value Ref Range   Glucose 81 65 - 99 mg/dL   BUN 17 6 - 24 mg/dL   Creatinine, Ser 0.99 0.76 - 1.27 mg/dL   GFR calc non Af Amer 92 >59 mL/min/1.73   GFR calc Af Amer 106 >59 mL/min/1.73   BUN/Creatinine Ratio 17 9 - 20    Sodium 140 134 - 144 mmol/L   Potassium 4.0 3.5 - 5.2 mmol/L   Chloride 104 96 - 106 mmol/L   CO2 23 20 - 29 mmol/L   Calcium 9.3 8.7 - 10.2 mg/dL   Total Protein 6.6 6.0 - 8.5 g/dL   Albumin 4.3 4.0 - 5.0 g/dL   Globulin, Total 2.3 1.5 - 4.5 g/dL   Albumin/Globulin Ratio 1.9 1.2 - 2.2   Bilirubin Total 0.3 0.0 - 1.2 mg/dL   Alkaline Phosphatase 94 48 - 121 IU/L   AST 19 0 - 40 IU/L   ALT 19 0 - 44 IU/L  Lipid Panel w/o Chol/HDL Ratio  Result Value Ref Range   Cholesterol, Total 182 100 - 199 mg/dL   Triglycerides 106 0 - 149 mg/dL   HDL 52 >39 mg/dL   VLDL Cholesterol Cal 19 5 - 40 mg/dL   LDL Chol Calc (NIH) 111 (H) 0 - 99 mg/dL  TSH  Result Value Ref Range   TSH 1.350 0.450 - 4.500 uIU/mL  PSA  Result Value  Ref Range   Prostate Specific Ag, Serum 0.9 0.0 - 4.0 ng/mL  Hgb A1c w/o eAG  Result Value Ref Range   Hgb A1c MFr Bld 5.7 (H) 4.8 - 5.6 %      Assessment & Plan:   Problem List Items Addressed This Visit   None Visit Diagnoses     Routine general medical examination at a health care facility    -  Primary   Vaccines up to date. Screening labs checked today. Colonoscopy ordered today. Continue diet and exercise. Call with any concerns. Conitnue to monitor.    Relevant Orders   Comprehensive metabolic panel   CBC with Differential/Platelet   Lipid Panel w/o Chol/HDL Ratio   PSA   TSH   Urinalysis, Routine w reflex microscopic   Bayer DCA Hb A1c Waived   Hepatitis C Antibody   Screening for colon cancer       Referral to GI placed today.   Relevant Orders   Ambulatory referral to Gastroenterology        LABORATORY TESTING:  Health maintenance labs ordered today as discussed above.   The natural history of prostate cancer and ongoing controversy regarding screening and potential treatment outcomes of prostate cancer has been discussed with the patient. The meaning of a false positive PSA and a false negative PSA has been discussed. He indicates  understanding of the limitations of this screening test and wishes to proceed with screening PSA testing.   IMMUNIZATIONS:   - Tdap: Tetanus vaccination status reviewed: last tetanus booster within 10 years. - Influenza: Up to date - Pneumovax: Up to date - COVID: Refused  SCREENING: - Colonoscopy: Ordered today  Discussed with patient purpose of the colonoscopy is to detect colon cancer at curable precancerous or early stages   PATIENT COUNSELING:    Sexuality: Discussed sexually transmitted diseases, partner selection, use of condoms, avoidance of unintended pregnancy  and contraceptive alternatives.   Advised to avoid cigarette smoking.  I discussed with the patient that most people either abstain from alcohol or drink within safe limits (<=14/week and <=4 drinks/occasion for males, <=7/weeks and <= 3 drinks/occasion for females) and that the risk for alcohol disorders and other health effects rises proportionally with the number of drinks per week and how often a drinker exceeds daily limits.  Discussed cessation/primary prevention of drug use and availability of treatment for abuse.   Diet: Encouraged to adjust caloric intake to maintain  or achieve ideal body weight, to reduce intake of dietary saturated fat and total fat, to limit sodium intake by avoiding high sodium foods and not adding table salt, and to maintain adequate dietary potassium and calcium preferably from fresh fruits, vegetables, and low-fat dairy products.    stressed the importance of regular exercise  Injury prevention: Discussed safety belts, safety helmets, smoke detector, smoking near bedding or upholstery.   Dental health: Discussed importance of regular tooth brushing, flossing, and dental visits.   Follow up plan: NEXT PREVENTATIVE PHYSICAL DUE IN 1 YEAR. Return in about 1 year (around 03/11/2022) for physical.

## 2021-03-12 LAB — LIPID PANEL W/O CHOL/HDL RATIO
Cholesterol, Total: 199 mg/dL (ref 100–199)
HDL: 48 mg/dL (ref >39)
LDL Chol Calc (NIH): 116 mg/dL — ABNORMAL HIGH (ref 0–99)
Triglycerides: 203 mg/dL — ABNORMAL HIGH (ref 0–149)
VLDL Cholesterol Cal: 35 mg/dL (ref 5–40)

## 2021-03-12 LAB — COMPREHENSIVE METABOLIC PANEL
ALT: 26 IU/L (ref 0–44)
AST: 25 IU/L (ref 0–40)
Albumin/Globulin Ratio: 1.8 (ref 1.2–2.2)
Albumin: 4.5 g/dL (ref 4.0–5.0)
Alkaline Phosphatase: 99 IU/L (ref 44–121)
BUN/Creatinine Ratio: 18 (ref 9–20)
BUN: 17 mg/dL (ref 6–24)
Bilirubin Total: 0.2 mg/dL (ref 0.0–1.2)
CO2: 23 mmol/L (ref 20–29)
Calcium: 9.2 mg/dL (ref 8.7–10.2)
Chloride: 101 mmol/L (ref 96–106)
Creatinine, Ser: 0.93 mg/dL (ref 0.76–1.27)
Globulin, Total: 2.5 g/dL (ref 1.5–4.5)
Glucose: 89 mg/dL (ref 65–99)
Potassium: 4.3 mmol/L (ref 3.5–5.2)
Sodium: 138 mmol/L (ref 134–144)
Total Protein: 7 g/dL (ref 6.0–8.5)
eGFR: 103 mL/min/{1.73_m2} (ref >59)

## 2021-03-12 LAB — CBC WITH DIFFERENTIAL/PLATELET
Basophils Absolute: 0.1 10*3/uL (ref 0.0–0.2)
Basos: 1 %
EOS (ABSOLUTE): 0.2 10*3/uL (ref 0.0–0.4)
Eos: 2 %
Hematocrit: 47.5 % (ref 37.5–51.0)
Hemoglobin: 16.6 g/dL (ref 13.0–17.7)
Immature Grans (Abs): 0 10*3/uL (ref 0.0–0.1)
Immature Granulocytes: 0 %
Lymphocytes Absolute: 3.6 10*3/uL — ABNORMAL HIGH (ref 0.7–3.1)
Lymphs: 37 %
MCH: 30.7 pg (ref 26.6–33.0)
MCHC: 34.9 g/dL (ref 31.5–35.7)
MCV: 88 fL (ref 79–97)
Monocytes Absolute: 1 10*3/uL — ABNORMAL HIGH (ref 0.1–0.9)
Monocytes: 10 %
Neutrophils Absolute: 4.8 10*3/uL (ref 1.4–7.0)
Neutrophils: 50 %
Platelets: 273 10*3/uL (ref 150–450)
RBC: 5.4 x10E6/uL (ref 4.14–5.80)
RDW: 12.8 % (ref 11.6–15.4)
WBC: 9.7 10*3/uL (ref 3.4–10.8)

## 2021-03-12 LAB — PSA: Prostate Specific Ag, Serum: 0.8 ng/mL (ref 0.0–4.0)

## 2021-03-12 LAB — TSH: TSH: 1.29 u[IU]/mL (ref 0.450–4.500)

## 2021-03-12 LAB — HEPATITIS C ANTIBODY: Hep C Virus Ab: <0.1 s/co ratio (ref 0.0–0.9)

## 2021-04-13 ENCOUNTER — Telehealth (INDEPENDENT_AMBULATORY_CARE_PROVIDER_SITE_OTHER): Payer: Self-pay | Admitting: Gastroenterology

## 2021-04-13 DIAGNOSIS — Z1211 Encounter for screening for malignant neoplasm of colon: Secondary | ICD-10-CM

## 2021-04-13 MED ORDER — PEG 3350-KCL-NA BICARB-NACL 420 G PO SOLR: 4000.0000 mL | Freq: Once | ORAL | 0 refills | Status: AC

## 2021-04-13 NOTE — Progress Notes (Signed)
Gastroenterology Pre-Procedure Review  Request Date: 05/13/21 Requesting Physician: Dr. Vicente Males  PATIENT REVIEW QUESTIONS: The patient responded to the following health history questions as indicated:    1. Are you having any GI issues?  Every once in a while nausea. 2. Do you have a personal history of Polyps? no 3. Do you have a family history of Colon Cancer or Polyps? no 4. Diabetes Mellitus? no 5. Joint replacements in the past 12 months?no 6. Major health problems in the past 3 months?no 7. Any artificial heart valves, MVP, or defibrillator?no    MEDICATIONS & ALLERGIES:    Patient reports the following regarding taking any anticoagulation/antiplatelet therapy:   Plavix, Coumadin, Eliquis, Xarelto, Lovenox, Pradaxa, Brilinta, or Effient? no Aspirin? no  Patient confirms/reports the following medications:  Current Outpatient Medications  Medication Sig Dispense Refill   albuterol (VENTOLIN HFA) 108 (90 Base) MCG/ACT inhaler TAKE 2 PUFFS BY MOUTH EVERY 6 HOURS AS NEEDED FOR WHEEZE OR SHORTNESS OF BREATH 18 each 6   budesonide-formoterol (SYMBICORT) 160-4.5 MCG/ACT inhaler TAKE 2 PUFFS BY MOUTH TWICE A DAY 30.6 each 3   cetirizine (ZYRTEC) 10 MG tablet TAKE 1 TABLET BY MOUTH EVERY DAY 30 tablet 0   ketoconazole (NIZORAL) 2 % shampoo Apply 1 application topically 2 (two) times a week. 120 mL 1   No current facility-administered medications for this visit.    Patient confirms/reports the following allergies:  No Known Allergies  No orders of the defined types were placed in this encounter.   AUTHORIZATION INFORMATION Primary Insurance: 1D#: Group #:  Secondary Insurance: 1D#: Group #:  SCHEDULE INFORMATION: Date: 05/13/21 Time: Location: [place]

## 2021-04-15 DIAGNOSIS — R519 Headache, unspecified: Secondary | ICD-10-CM | POA: Diagnosis not present

## 2021-04-15 DIAGNOSIS — Z79899 Other long term (current) drug therapy: Secondary | ICD-10-CM | POA: Diagnosis not present

## 2021-04-15 DIAGNOSIS — H524 Presbyopia: Secondary | ICD-10-CM | POA: Diagnosis not present

## 2021-04-15 DIAGNOSIS — G43019 Migraine without aura, intractable, without status migrainosus: Secondary | ICD-10-CM | POA: Diagnosis not present

## 2021-05-10 ENCOUNTER — Telehealth: Payer: Self-pay | Admitting: Gastroenterology

## 2021-05-10 NOTE — Telephone Encounter (Signed)
Pt. Has a family emergency. Needs to reschedule his procedure

## 2021-05-11 NOTE — Telephone Encounter (Signed)
Called patient back and he agreed on getting his procedure on 07/01/2021. Instructions were provided in Spanish by me and patient understood and had no further questions. The endo unit will be contacted for the date change.

## 2021-06-30 ENCOUNTER — Other Ambulatory Visit: Payer: Self-pay

## 2021-06-30 DIAGNOSIS — Z1211 Encounter for screening for malignant neoplasm of colon: Secondary | ICD-10-CM

## 2021-07-01 ENCOUNTER — Encounter: Admission: RE | Payer: Self-pay | Source: Home / Self Care

## 2021-07-01 ENCOUNTER — Ambulatory Visit
Admission: RE | Admit: 2021-07-01 | Payer: BC Managed Care – PPO | Source: Home / Self Care | Admitting: Gastroenterology

## 2021-07-01 SURGERY — COLONOSCOPY WITH PROPOFOL
Anesthesia: General

## 2021-07-11 ENCOUNTER — Other Ambulatory Visit: Payer: Self-pay | Admitting: Neurology

## 2021-07-11 DIAGNOSIS — R519 Headache, unspecified: Secondary | ICD-10-CM

## 2021-07-19 ENCOUNTER — Other Ambulatory Visit: Payer: Self-pay

## 2021-07-19 ENCOUNTER — Ambulatory Visit
Admission: RE | Admit: 2021-07-19 | Discharge: 2021-07-19 | Disposition: A | Discharge status: Home / Self Care | Payer: BC Managed Care – PPO | Source: Ambulatory Visit | Attending: Neurology | Admitting: Neurology

## 2021-07-19 DIAGNOSIS — R519 Headache, unspecified: Secondary | ICD-10-CM | POA: Diagnosis not present

## 2021-07-19 DIAGNOSIS — G43909 Migraine, unspecified, not intractable, without status migrainosus: Secondary | ICD-10-CM | POA: Diagnosis not present

## 2021-07-19 MED ORDER — GADOBUTROL 1 MMOL/ML IV SOLN
7.0000 mL | Freq: Once | INTRAVENOUS | Status: AC | PRN
  Administered 2021-07-19: 7.5 mL via INTRAVENOUS

## 2021-08-29 DIAGNOSIS — Z79899 Other long term (current) drug therapy: Secondary | ICD-10-CM | POA: Diagnosis not present

## 2021-08-29 DIAGNOSIS — E236 Other disorders of pituitary gland: Secondary | ICD-10-CM | POA: Diagnosis not present

## 2021-08-29 DIAGNOSIS — G43019 Migraine without aura, intractable, without status migrainosus: Secondary | ICD-10-CM | POA: Diagnosis not present

## 2021-09-01 ENCOUNTER — Encounter: Payer: Self-pay | Admitting: Gastroenterology

## 2021-09-02 ENCOUNTER — Encounter: Payer: Self-pay | Admitting: Gastroenterology

## 2021-09-02 ENCOUNTER — Other Ambulatory Visit: Payer: Self-pay

## 2021-09-02 ENCOUNTER — Ambulatory Visit: Payer: BC Managed Care – PPO | Admitting: Anesthesiology

## 2021-09-02 ENCOUNTER — Encounter
Admission: RE | Disposition: A | Discharge status: Home / Self Care | Payer: Self-pay | Source: Home / Self Care | Attending: Gastroenterology

## 2021-09-02 ENCOUNTER — Ambulatory Visit
Admission: RE | Admit: 2021-09-02 | Discharge: 2021-09-02 | Disposition: A | Discharge status: Home / Self Care | Payer: BC Managed Care – PPO | Attending: Gastroenterology | Admitting: Gastroenterology

## 2021-09-02 DIAGNOSIS — K635 Polyp of colon: Secondary | ICD-10-CM | POA: Diagnosis not present

## 2021-09-02 DIAGNOSIS — K5730 Diverticulosis of large intestine without perforation or abscess without bleeding: Secondary | ICD-10-CM | POA: Insufficient documentation

## 2021-09-02 DIAGNOSIS — Z1211 Encounter for screening for malignant neoplasm of colon: Secondary | ICD-10-CM | POA: Diagnosis not present

## 2021-09-02 HISTORY — PX: COLONOSCOPY WITH PROPOFOL: SHX5780

## 2021-09-02 SURGERY — COLONOSCOPY WITH PROPOFOL
Anesthesia: General

## 2021-09-02 MED ORDER — EPHEDRINE SULFATE 50 MG/ML IJ SOLN
INTRAMUSCULAR | Status: DC | PRN
  Administered 2021-09-02: 5 mg via INTRAVENOUS

## 2021-09-02 MED ORDER — PROPOFOL 10 MG/ML IV BOLUS
INTRAVENOUS | Status: DC | PRN
  Administered 2021-09-02: 30 mg via INTRAVENOUS
  Administered 2021-09-02: 70 mg via INTRAVENOUS

## 2021-09-02 MED ORDER — SODIUM CHLORIDE 0.9 % IV SOLN: INTRAVENOUS | Status: DC

## 2021-09-02 MED ORDER — PROPOFOL 500 MG/50ML IV EMUL
INTRAVENOUS | Status: DC | PRN
  Administered 2021-09-02: 140 ug/kg/min via INTRAVENOUS

## 2021-09-02 MED ORDER — PHENYLEPHRINE HCL (PRESSORS) 10 MG/ML IV SOLN
INTRAVENOUS | Status: DC | PRN
  Administered 2021-09-02: 80 ug via INTRAVENOUS

## 2021-09-02 NOTE — Anesthesia Postprocedure Evaluation (Signed)
Anesthesia Post Note  Patient: Roger Reynolds  Procedure(s) Performed: COLONOSCOPY WITH PROPOFOL  Patient location during evaluation: Endoscopy Anesthesia Type: General Level of consciousness: awake and alert Pain management: pain level controlled Vital Signs Assessment: post-procedure vital signs reviewed and stable Respiratory status: spontaneous breathing, nonlabored ventilation, respiratory function stable and patient connected to nasal cannula oxygen Cardiovascular status: blood pressure returned to baseline and stable Postop Assessment: no apparent nausea or vomiting Anesthetic complications: no   No notable events documented.   Last Vitals:  Vitals:   09/02/21 1024 09/02/21 1210  BP: 121/83 90/62  Pulse: 62   Resp: 20 (!) 69  Temp: (!) 36 C 36.8 C  SpO2: 98%     Last Pain:  Vitals:   09/02/21 1230  TempSrc:   PainSc: 0-No pain                 Arita Miss

## 2021-09-02 NOTE — Anesthesia Preprocedure Evaluation (Signed)
Anesthesia Evaluation  Patient identified by MRN, date of birth, ID band Patient awake    Reviewed: Allergy & Precautions, NPO status , Patient's Chart, lab work & pertinent test results  History of Anesthesia Complications Negative for: history of anesthetic complications  Airway Mallampati: II  TM Distance: >3 FB Neck ROM: Full    Dental no notable dental hx. (+) Teeth Intact   Pulmonary asthma , neg sleep apnea, neg COPD, Patient abstained from smoking.Not current smoker,    Pulmonary exam normal breath sounds clear to auscultation       Cardiovascular Exercise Tolerance: Good METS(-) hypertension(-) CAD and (-) Past MI negative cardio ROS  (-) dysrhythmias  Rhythm:Regular Rate:Normal - Systolic murmurs    Neuro/Psych  Headaches, negative neurological ROS  negative psych ROS   GI/Hepatic neg GERD  ,(+)     (-) substance abuse  ,   Endo/Other  neg diabetes  Renal/GU negative Renal ROS     Musculoskeletal   Abdominal   Peds  Hematology   Anesthesia Other Findings Past Medical History: No date: Asthma  Reproductive/Obstetrics                             Anesthesia Physical Anesthesia Plan  ASA: 2  Anesthesia Plan: General   Post-op Pain Management: Minimal or no pain anticipated   Induction: Intravenous  PONV Risk Score and Plan: 2 and Propofol infusion, TIVA and Ondansetron  Airway Management Planned: Nasal Cannula  Additional Equipment: None  Intra-op Plan:   Post-operative Plan:   Informed Consent: I have reviewed the patients History and Physical, chart, labs and discussed the procedure including the risks, benefits and alternatives for the proposed anesthesia with the patient or authorized representative who has indicated his/her understanding and acceptance.     Dental advisory given  Plan Discussed with: CRNA and Surgeon  Anesthesia Plan Comments:  (Discussed risks of anesthesia with patient, including possibility of difficulty with spontaneous ventilation under anesthesia necessitating airway intervention, PONV, and rare risks such as cardiac or respiratory or neurological events, and allergic reactions. Discussed the role of CRNA in patient's perioperative care. Patient understands.)        Anesthesia Quick Evaluation

## 2021-09-02 NOTE — Transfer of Care (Signed)
Immediate Anesthesia Transfer of Care Note  Patient: Roger Reynolds  Procedure(s) Performed: COLONOSCOPY WITH PROPOFOL  Patient Location: PACU  Anesthesia Type:General  Level of Consciousness: sedated  Airway & Oxygen Therapy: Patient Spontanous Breathing  Post-op Assessment: Report given to RN and Post -op Vital signs reviewed and stable  Post vital signs: Reviewed and stable  Last Vitals:  Vitals Value Taken Time  BP 90/62 09/02/21 1212  Temp    Pulse 80 09/02/21 1212  Resp 19 09/02/21 1212  SpO2 94 % 09/02/21 1212  Vitals shown include unvalidated device data.  Last Pain:  Vitals:   09/02/21 1024  TempSrc: Temporal  PainSc: 0-No pain         Complications: No notable events documented.

## 2021-09-02 NOTE — H&P (Signed)
Vonda Antigua, MD [address] [place], [place], [place], [address] [address], [place], [place], [place], [address] Phone: [phone]  Fax: [phone]  Primary Care Physician:  Valerie Roys, DO   Pre-Procedure History & Physical: HPI:  Roger Reynolds is a 45 y.o. male is here for a colonoscopy.   Past Medical History:  Diagnosis Date   Asthma     History reviewed. No pertinent surgical history.  Prior to Admission medications   Medication Sig Start Date End Date Taking? Authorizing Provider  cetirizine (ZYRTEC) 10 MG tablet TAKE 1 TABLET BY MOUTH EVERY DAY 03/02/21  Yes Johnson, Megan P, DO  albuterol (VENTOLIN HFA) 108 (90 Base) MCG/ACT inhaler TAKE 2 PUFFS BY MOUTH EVERY 6 HOURS AS NEEDED FOR WHEEZE OR SHORTNESS OF BREATH 03/11/21   Johnson, Megan P, DO  budesonide-formoterol (SYMBICORT) 160-4.5 MCG/ACT inhaler TAKE 2 PUFFS BY MOUTH TWICE A DAY 03/11/21   Johnson, Megan P, DO  ketoconazole (NIZORAL) 2 % shampoo Apply 1 application topically 2 (two) times a week. 01/24/21   Charyl Dancer, NP    Allergies as of 07/01/2021   (No Known Allergies)    Family History  Problem Relation Age of Onset   Stroke Mother    Alcohol abuse Father     Social History   Socioeconomic History   Marital status: Married    Spouse name: Not on file   Number of children: Not on file   Years of education: Not on file   Highest education level: Not on file  Occupational History   Not on file  Tobacco Use   Smoking status: Never   Smokeless tobacco: Never  Vaping Use   Vaping Use: Never used  Substance and Sexual Activity   Alcohol use: Yes    Comment: on occasion. non last 24hrs   Drug use: No   Sexual activity: Yes  Other Topics Concern   Not on file  Social History Narrative   Not on file   Social Determinants of Health   Financial Resource Strain: Not on file  Food Insecurity: Not on file  Transportation Needs: Not on file  Physical Activity: Not on file   Stress: Not on file  Social Connections: Not on file  Intimate Partner Violence: Not on file    Review of Systems: See HPI, otherwise negative ROS  Physical Exam: Constitutional: General:   Alert,  Well-developed, well-nourished, pleasant and cooperative in NAD BP 121/83   Pulse 62   Temp (!) 96.8 F (36 C) (Temporal)   Resp 20   Ht 5\' 4"  (1.626 m)   Wt 78.5 kg   SpO2 98%   BMI 29.70 kg/m   Head: Normocephalic, atraumatic.   Eyes:  Sclera clear, no icterus.   Conjunctiva pink.   Mouth:  No deformity or lesions, oropharynx pink & moist.  Neck:  Supple, trachea midline  Respiratory: Normal respiratory effort  Gastrointestinal:  Soft, non-tender and non-distended without masses, hepatosplenomegaly or hernias noted.  No guarding or rebound tenderness.     Cardiac: No clubbing or edema.  No cyanosis. Normal posterior tibial pedal pulses noted.  Lymphatic:  No significant cervical adenopathy.  Psych:  Alert and cooperative. Normal mood and affect.  Musculoskeletal:   Symmetrical without gross deformities. 5/5 Lower extremity strength bilaterally.  Skin: Warm. Intact without significant lesions or rashes. No jaundice.  Neurologic:  Face symmetrical, tongue midline, Normal sensation to touch;  grossly normal neurologically.  Psych:  Alert and oriented  x3, Alert and cooperative. Normal mood and affect.  Impression/Plan: Roger Reynolds is here for a colonoscopy to be performed for average risk screening.  Risks, benefits, limitations, and alternatives regarding  colonoscopy have been reviewed with the patient.  Questions have been answered.  All parties agreeable.   Virgel Manifold, MD  09/02/2021, 11:30 AM

## 2021-09-02 NOTE — Op Note (Signed)
[facility] Patient Name: Roger Reynolds Procedure Date: 09/02/2021 11:28 AM MRN: [id number] Account #: [id number] Date of Birth: [birth year] Admit Type: Outpatient Age: 45 Room: [facility] ENDO ROOM 2 Gender: Male Note Status: Finalized Instrument Name: Jasper Riling [id number] Procedure:             Colonoscopy Indications:           Screening for colorectal malignant neoplasm Providers:             Amyah Clawson B. Bonna Gains MD, MD Referring MD:          Valerie Roys (Referring MD) Medicines:             Monitored Anesthesia Care Complications:         No immediate complications. Procedure:             Pre-Anesthesia Assessment:                        - ASA Grade Assessment: II - A patient with mild                         systemic disease.                        - Prior to the procedure, a History and Physical was                         performed, and patient medications, allergies and                         sensitivities were reviewed. The patient's tolerance                         of previous anesthesia was reviewed.                        - The risks and benefits of the procedure and the                         sedation options and risks were discussed with the                         patient. All questions were answered and informed                         consent was obtained.                        - Patient identification and proposed procedure were                         verified prior to the procedure by the physician, the                         nurse, the anesthesiologist, the anesthetist and the                         technician. The procedure was verified in the  procedure room.                        After obtaining informed consent, the colonoscope was                         passed under direct vision. Throughout the procedure,                         the patient's blood pressure, pulse, and oxygen                          saturations were monitored continuously. The                         Colonoscope was introduced through the anus and                         advanced to the the cecum, identified by appendiceal                         orifice and ileocecal valve. The colonoscopy was                         performed with ease. The patient tolerated the                         procedure well. The quality of the bowel preparation                         was fair. Findings:      The perianal and digital rectal examinations were normal.      Two flat polyps were found in the descending colon. The polyps were 3 to       4 mm in size. These polyps were removed with a jumbo cold forceps.       Resection and retrieval were complete.      Multiple diverticula were found in the sigmoid colon.      The exam was otherwise without abnormality.      The rectum, sigmoid colon, descending colon, transverse colon, ascending       colon and cecum appeared normal.      The retroflexed view of the distal rectum and anal verge was normal and       showed no anal or rectal abnormalities. Impression:            - Preparation of the colon was fair.                        - Two 3 to 4 mm polyps in the descending colon,                         removed with a jumbo cold forceps. Resected and                         retrieved.                        - Diverticulosis in the sigmoid colon.                        -  The examination was otherwise normal.                        - The rectum, sigmoid colon, descending colon,                         transverse colon, ascending colon and cecum are normal.                        - The distal rectum and anal verge are normal on                         retroflexion view. Recommendation:        - Discharge patient to home (with escort).                        - High fiber diet.                        - Advance diet as tolerated.                        - Continue present medications.                         - Await pathology results.                        - Repeat colonoscopy date to be determined after                         pending pathology results are reviewed.                        - The findings and recommendations were discussed with                         the patient.                        - The findings and recommendations were discussed with                         the patient's family.                        - Return to primary care physician as previously                         scheduled. Procedure Code(s):     --- Professional ---                        [phone], Colonoscopy, flexible; with biopsy, single or                         multiple Diagnosis Code(s):     --- Professional ---                        Z12.11, Encounter for screening for malignant neoplasm  of colon                        K63.5, Polyp of colon CPT copyright 2019 American Medical Association. All rights reserved. The codes documented in this report are preliminary and upon coder review may  be revised to meet current compliance requirements.  Vonda Antigua, MD Margretta Sidle B. Bonna Gains MD, MD 09/02/2021 12:13:57 PM This report has been signed electronically. Number of Addenda: 0 Note Initiated On: 09/02/2021 11:28 AM Scope Withdrawal Time: 0 hours 22 minutes 28 seconds  Total Procedure Duration: 0 hours 25 minutes 20 seconds  Estimated Blood Loss:  Estimated blood loss: none.      [facility]

## 2021-09-05 ENCOUNTER — Encounter: Payer: Self-pay | Admitting: Gastroenterology

## 2021-09-05 LAB — SURGICAL PATHOLOGY

## 2021-09-07 ENCOUNTER — Encounter: Payer: Self-pay | Admitting: Gastroenterology

## 2021-12-30 ENCOUNTER — Other Ambulatory Visit: Payer: Self-pay | Admitting: Family Medicine

## 2022-01-02 NOTE — Telephone Encounter (Signed)
Requested Prescriptions  ?Pending Prescriptions Disp Refills  ?? cetirizine (ZYRTEC) 10 MG tablet [Pharmacy Med Name: CETIRIZINE HCL 10 MG TABLET] 30 tablet 0  ?  Sig: TAKE 1 TABLET BY MOUTH EVERY DAY  ?  ? Ear, Nose, and Throat:  Antihistamines 2 Passed - 12/30/2021 11:23 AM  ?  ?  Passed - Cr in normal range and within 360 days  ?  Creatinine, Ser  ?Date Value Ref Range Status  ?03/11/2021 0.93 0.76 - 1.27 mg/dL Final  ?   ?  ?  Passed - Valid encounter within last 12 months  ?  Recent Outpatient Visits   ?      ? 9 months ago Routine general medical examination at a health care facility  ? [place], Megan P, DO  ? 11 months ago Intractable migraine with aura without status migrainosus  ? [place], NP  ? 1 year ago Need for Tdap vaccination  ? [facility] Kathrine Haddock, NP  ? 1 year ago Moderate persistent asthma with exacerbation  ? [place], [place] P, DO  ? 3 years ago Mild asthma without complication, unspecified whether persistent  ? [place], [place] P, DO  ?  ?  ?Future Appointments   ?        ? In 2 months Wynetta Emery, Barb Merino, DO [facility]  ?  ? ?  ?  ?  ? ?

## 2022-01-26 ENCOUNTER — Other Ambulatory Visit: Payer: Self-pay | Admitting: Family Medicine

## 2022-01-26 NOTE — Telephone Encounter (Signed)
Requested Prescriptions  ?Pending Prescriptions Disp Refills  ?? cetirizine (ZYRTEC) 10 MG tablet [Pharmacy Med Name: CETIRIZINE HCL 10 MG TABLET] 30 tablet 1  ?  Sig: TAKE 1 TABLET BY MOUTH EVERY DAY  ?  ? Ear, Nose, and Throat:  Antihistamines 2 Passed - 01/26/2022 11:32 AM  ?  ?  Passed - Cr in normal range and within 360 days  ?  Creatinine, Ser  ?Date Value Ref Range Status  ?03/11/2021 0.93 0.76 - 1.27 mg/dL Final  ?   ?  ?  Passed - Valid encounter within last 12 months  ?  Recent Outpatient Visits   ?      ? 10 months ago Routine general medical examination at a health care facility  ? [place], [place] P, DO  ? 1 year ago Intractable migraine with aura without status migrainosus  ? [place], NP  ? 1 year ago Need for Tdap vaccination  ? [facility] Kathrine Haddock, NP  ? 1 year ago Moderate persistent asthma with exacerbation  ? [place], [place] P, DO  ? 3 years ago Mild asthma without complication, unspecified whether persistent  ? [place], [place] P, DO  ?  ?  ?Future Appointments   ?        ? In 1 month Johnson, Barb Merino, DO [facility]  ?  ? ?  ?  ?  ? ?

## 2022-02-21 ENCOUNTER — Other Ambulatory Visit: Payer: Self-pay | Admitting: Family Medicine

## 2022-02-22 NOTE — Telephone Encounter (Signed)
Pharmacy still has a refill.

## 2022-03-17 ENCOUNTER — Encounter: Payer: BC Managed Care – PPO | Admitting: Family Medicine

## 2022-03-18 ENCOUNTER — Other Ambulatory Visit: Payer: Self-pay | Admitting: Family Medicine

## 2022-05-03 DIAGNOSIS — R7303 Prediabetes: Secondary | ICD-10-CM | POA: Insufficient documentation

## 2022-06-23 ENCOUNTER — Ambulatory Visit: Payer: BC Managed Care – PPO | Attending: Nurse Practitioner | Admitting: Nurse Practitioner

## 2022-06-23 ENCOUNTER — Encounter: Payer: Self-pay | Admitting: Nurse Practitioner

## 2022-06-23 VITALS — BP 121/79 | HR 63 | Temp 98.0°F | Ht 64.5 in | Wt 179.0 lb

## 2022-06-23 DIAGNOSIS — J4520 Mild intermittent asthma, uncomplicated: Secondary | ICD-10-CM | POA: Diagnosis not present

## 2022-06-23 DIAGNOSIS — Z7689 Persons encountering health services in other specified circumstances: Secondary | ICD-10-CM | POA: Diagnosis not present

## 2022-06-23 DIAGNOSIS — M545 Low back pain, unspecified: Secondary | ICD-10-CM | POA: Diagnosis not present

## 2022-06-23 DIAGNOSIS — G8929 Other chronic pain: Secondary | ICD-10-CM

## 2022-06-23 DIAGNOSIS — D179 Benign lipomatous neoplasm, unspecified: Secondary | ICD-10-CM | POA: Diagnosis not present

## 2022-06-23 MED ORDER — ALBUTEROL SULFATE HFA 108 (90 BASE) MCG/ACT IN AERS: INHALATION_SPRAY | RESPIRATORY_TRACT | 1 refills | Status: DC

## 2022-06-23 MED ORDER — CETIRIZINE HCL 10 MG PO TABS: 10.0000 mg | ORAL_TABLET | Freq: Every day | ORAL | 3 refills | Status: DC

## 2022-06-23 NOTE — Progress Notes (Unsigned)
   Assessment & Plan:  There are no diagnoses linked to this encounter.  Patient has been counseled on age-appropriate routine health concerns for screening and prevention. These are reviewed and up-to-date. Referrals have been placed accordingly. Immunizations are up-to-date or declined.    Subjective:   Chief Complaint  Patient presents with   Establish Care   HPI Roger Reynolds 46 y.o. male presents to office today to establish care.   He is accompanied by an onsite spanish intrepeter   He would like a referral to Dermatology for small lipomas on various areas of the body. These areas are very small however significant other is concerned that her brother had small lumps that turned into cancer.      He has a past medical history of Asthma.   :AST PCP: June 2022  Asthma on flares with seasons change.   ROS  Past Medical History:  Diagnosis Date   Asthma     Past Surgical History:  Procedure Laterality Date   COLONOSCOPY WITH PROPOFOL N/A 09/02/2021   Procedure: COLONOSCOPY WITH PROPOFOL;  Surgeon: Virgel Manifold, MD;  Location: [facility] ENDOSCOPY;  Service: Gastroenterology;  Laterality: N/A;    Family History  Problem Relation Age of Onset   Stroke Mother    Alcohol abuse Father     Social History Reviewed with no changes to be made today.   Outpatient Medications Prior to Visit  Medication Sig Dispense Refill   albuterol (VENTOLIN HFA) 108 (90 Base) MCG/ACT inhaler TAKE 2 PUFFS BY MOUTH EVERY 6 HOURS AS NEEDED FOR WHEEZE OR SHORTNESS OF BREATH 18 each 6   cetirizine (ZYRTEC) 10 MG tablet TAKE 1 TABLET BY MOUTH EVERY DAY 30 tablet 1   ketoconazole (NIZORAL) 2 % shampoo Apply 1 application topically 2 (two) times a week. 120 mL 1   budesonide-formoterol (SYMBICORT) 160-4.5 MCG/ACT inhaler TAKE 2 PUFFS BY MOUTH TWICE A DAY (Patient not taking: Reported on 06/23/2022) 30.6 each 3   No facility-administered medications prior to visit.    No Known Allergies      Objective:    BP 121/79   Pulse 63   Temp 98 F (36.7 C) (Oral)   Ht 5' 4.5" (1.638 m)   Wt 179 lb (81.2 kg)   SpO2 96%   BMI 30.25 kg/m  Wt Readings from Last 3 Encounters:  06/23/22 179 lb (81.2 kg)  09/02/21 173 lb (78.5 kg)  03/11/21 181 lb 3.2 oz (82.2 kg)    Physical Exam       Patient has been counseled extensively about nutrition and exercise as well as the importance of adherence with medications and regular follow-up. The patient was given clear instructions to go to ER or return to medical center if symptoms don't improve, worsen or new problems develop. The patient verbalized understanding.   Follow-up: No follow-ups on file.   Gildardo Pounds, FNP-BC [facility] [place], [place]   06/23/2022, 2:17 PM

## 2022-06-24 LAB — URINALYSIS, COMPLETE
Bilirubin, UA: NEGATIVE
Glucose, UA: NEGATIVE
Ketones, UA: NEGATIVE
Leukocytes,UA: NEGATIVE
Nitrite, UA: NEGATIVE
Protein,UA: NEGATIVE
RBC, UA: NEGATIVE
Specific Gravity, UA: 1.022 (ref 1.005–1.030)
Urobilinogen, Ur: 0.2 mg/dL (ref 0.2–1.0)
pH, UA: 5.5 (ref 5.0–7.5)

## 2022-06-24 LAB — MICROSCOPIC EXAMINATION
Bacteria, UA: NONE SEEN
Casts: NONE SEEN /lpf
Epithelial Cells (non renal): NONE SEEN /hpf (ref 0–10)
WBC, UA: NONE SEEN /hpf (ref 0–5)

## 2022-06-26 ENCOUNTER — Encounter: Payer: Self-pay | Admitting: Nurse Practitioner

## 2022-08-13 IMAGING — MR MR HEAD WO/W CM
13 series · 47 of 48 positions shown · IV contrast (gadavist)
Comparison: No pertinent prior exams available for comparison.

CLINICAL DATA: New onset headache. Additional history provided by
scanning technologist: Patient reports increasing migraines, "random
eye movement, worse on the left."

EXAM:
MRI HEAD WITHOUT AND WITH CONTRAST
TECHNIQUE: Multiplanar, multiecho pulse sequences of the brain and surrounding
structures were obtained without and with intravenous contrast.
CONTRAST:  7.5mL GADAVIST GADOBUTROL 1 MMOL/ML IV SOLN

[Series 5: ax dwi_tracew · axial · 3.0mm · 0.65mm/px · z∈[-94,+61]mm · 3 of 48 slices shown]
[im 1/48]
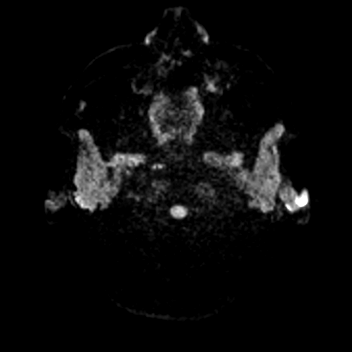
[im 24/48]
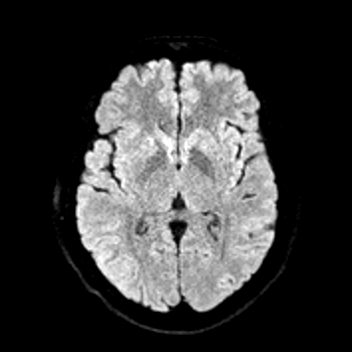
[im 48/48]
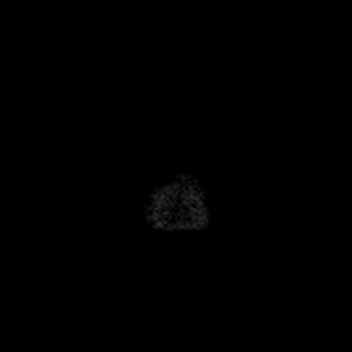

[Series 6: ax dwi_adc · axial · 3.0mm · 0.65mm/px · z∈[-94,+61]mm · 3 of 48 slices shown]
[im 1/48]
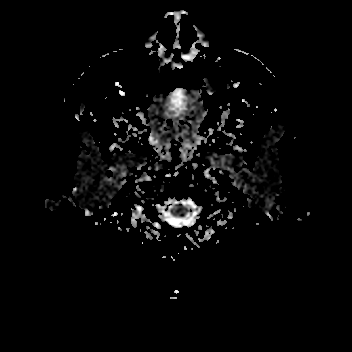
[im 24/48]
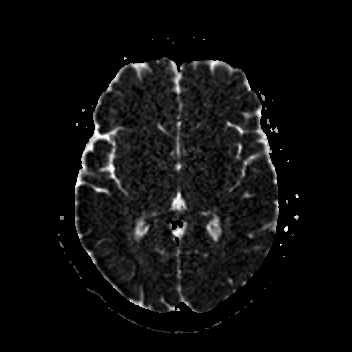
[im 48/48]
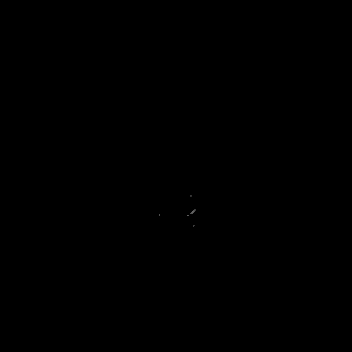

[Series 7: cor dwi_tracew · coronal · 5.0mm · 0.65mm/px · 2 of 40 slices shown]
[im 1/40]
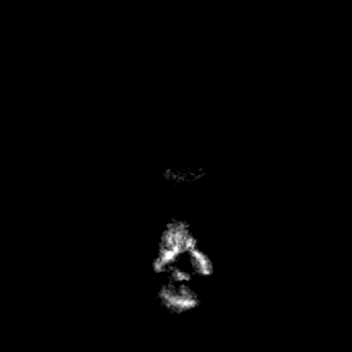
[im 40/40]
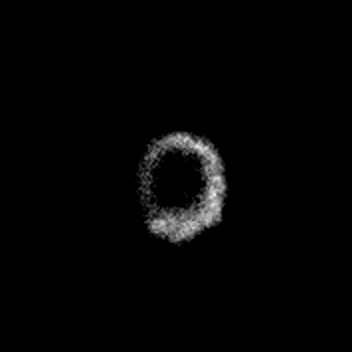

[Series 8: cor dwi_adc · coronal · 5.0mm · 0.65mm/px · 2 of 39 slices shown]
[im 1/39]
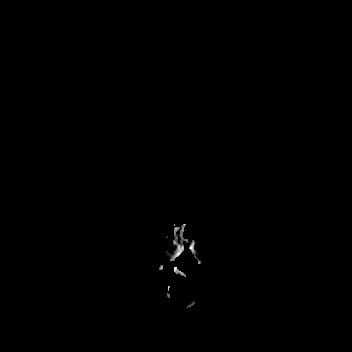
[im 39/39]
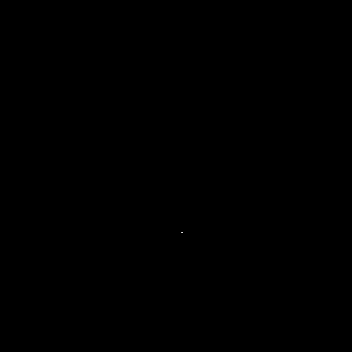

[Series 9: T1 · sagittal · 5.0mm · 0.62mm/px · 1 of 25 slices shown (1 of 2)]
[im 1/25]
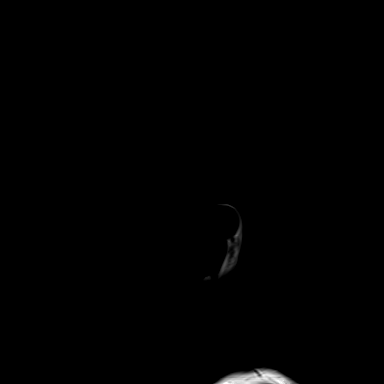

[Series 10: T2 · axial · 5.0mm · 0.53mm/px · z∈[-94,+62]mm · 2 of 27 slices shown]
[im 1/27]
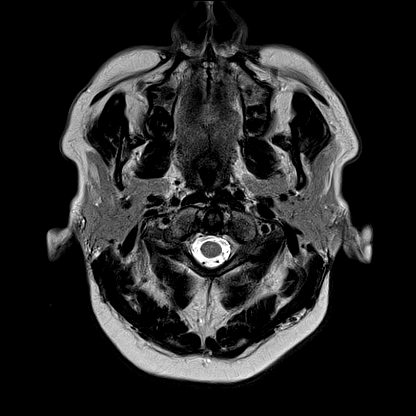
[im 27/27]
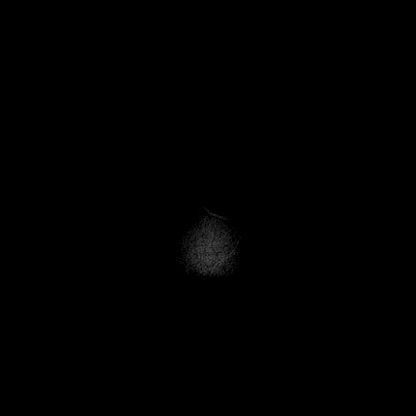

[Series 12: pha_images · axial · 3.0mm · 0.90mm/px · z∈[-105,+72]mm · 4 of 60 slices shown]
[im 1/60]
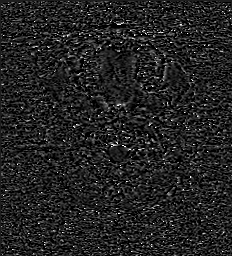
[im 20/60]
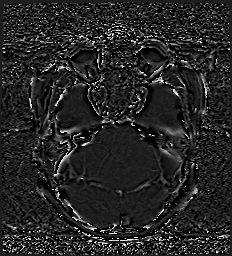
[im 40/60]
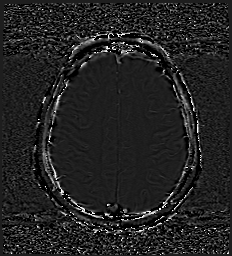
[im 60/60]
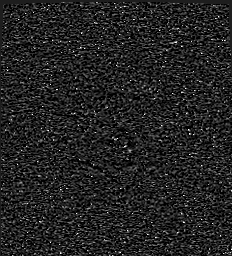

[Series 13: swi_images · axial · 3.0mm · 0.90mm/px · z∈[-105,+72]mm · 4 of 60 slices shown]
[im 1/60]
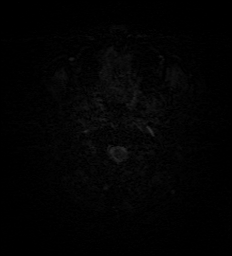
[im 20/60]
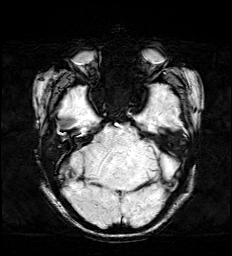
[im 40/60]
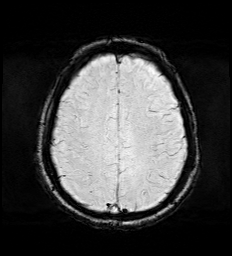
[im 60/60]
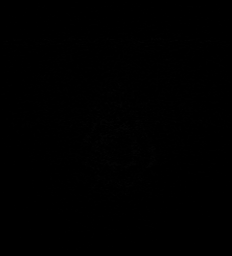

[Series 15: FLAIR · axial · 3.0mm · 0.53mm/px · z∈[-97,+65]mm · 3 of 55 slices shown]
[im 1/55]
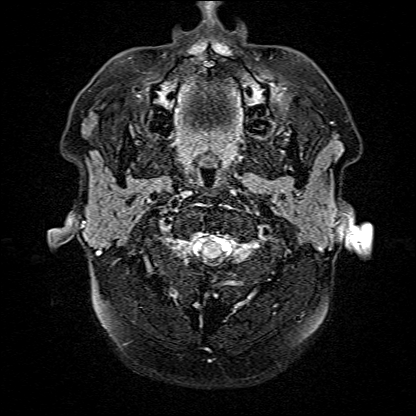
[im 28/55]
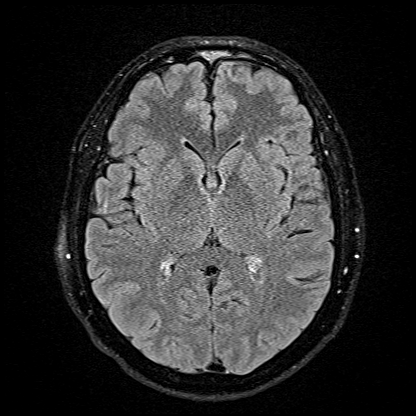
[im 55/55]
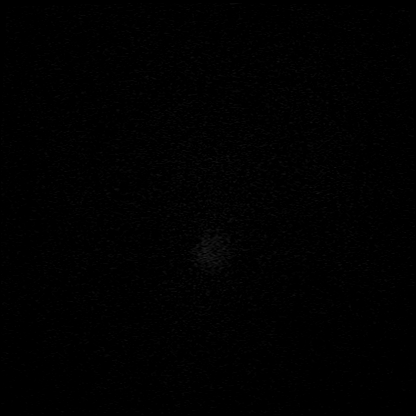

[Series 16: T1 · axial · 1.0mm · 0.98mm/px · z∈[-104,+70]mm · 9 of 176 slices shown (2 of 2)]
[im 1/176]
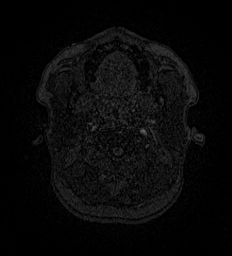
[im 20/176]
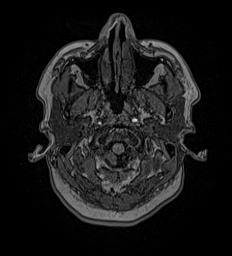
[im 39/176]
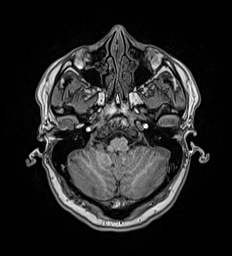
[im 59/176]
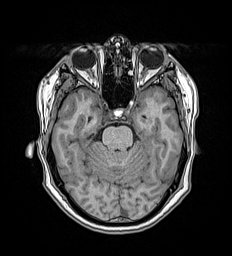
[im 78/176]
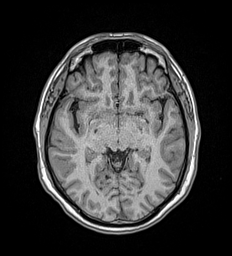
[im 98/176]
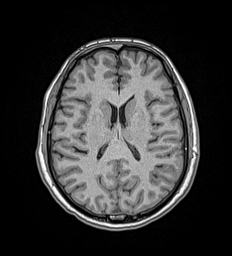
[im 117/176]
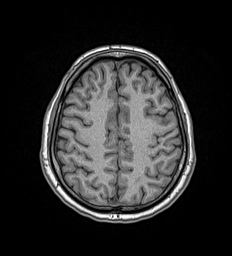
[im 156/176]
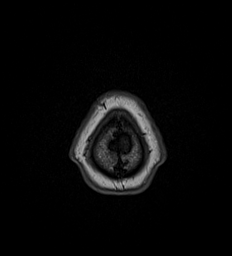
[im 176/176]
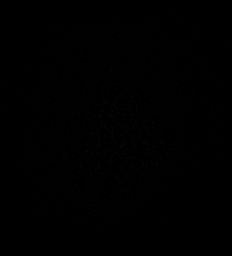

[Series 17: T2 post-contrast · coronal · 5.0mm · 0.57mm/px · 2 of 31 slices shown]
[im 1/31]
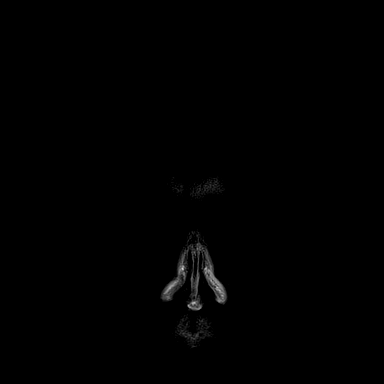
[im 31/31]
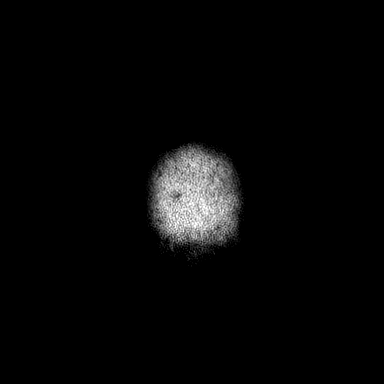

[Series 18: T1 post-contrast · axial · 1.0mm · 0.98mm/px · z∈[-104,+70]mm · 10 of 175 slices shown (1 of 2)]
[im 1/175]
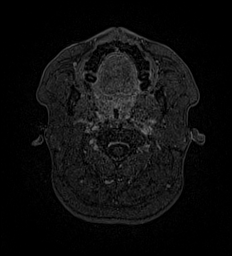
[im 20/175]
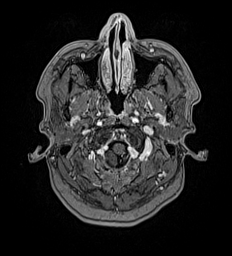
[im 39/175]
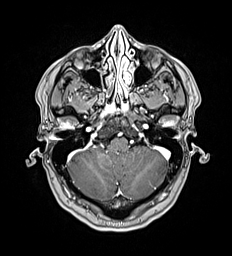
[im 59/175]
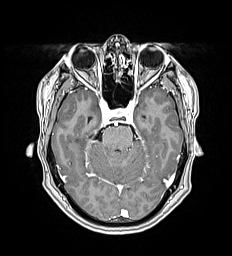
[im 78/175]
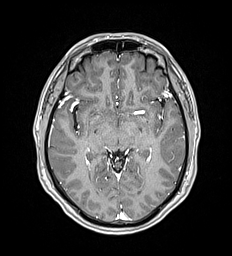
[im 97/175]
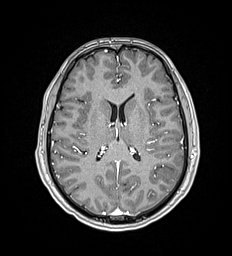
[im 117/175]
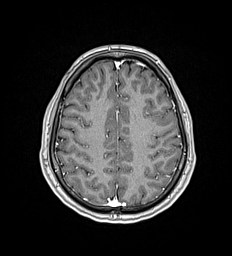
[im 136/175]
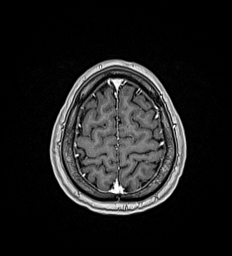
[im 155/175]
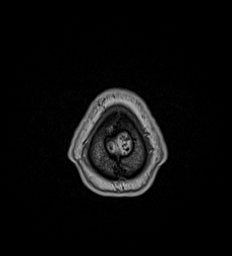
[im 175/175]
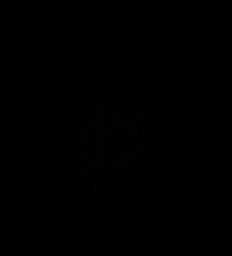

[Series 19: T1 post-contrast · coronal · 5.0mm · 0.57mm/px · 2 of 31 slices shown (2 of 2)]
[im 1/31]
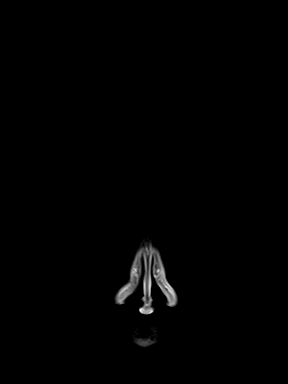
[im 31/31]
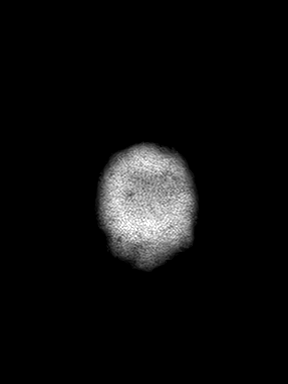

[47 of 48 positions shown; findings below may reference images not displayed]

FINDINGS: Brain:

Cerebral volume is normal.

Cerebellar tonsillar ectopia (greater on the right) with the right
cerebellar tonsil extending 4-5 mm below the level of the foramen
magnum. This is borderline by measurement criteria for a Chiari 1
malformation. However, the cerebellar tonsils have a normal rounded
morphology, and there is no mass effect upon the brainstem.

7 x 4 mm T1 hyperintense and hypoenhancing focus within the
mid-to-posterior aspect of the pituitary gland (for instance as seen
13).

No cortical encephalomalacia is identified. No significant cerebral
white matter disease.

There is no acute infarct.

No chronic intracranial blood products.

No extra-axial fluid collection.

No midline shift.

Vascular: Maintained flow voids within the proximal large arterial
vessels.

Skull and upper cervical spine: No focal suspicious marrow lesion.

Sinuses/Orbits: Visualized orbits show no acute finding. Trace
mucosal thickening within the bilateral ethmoid and maxillary
sinuses.

Impression #1 will be called to the ordering clinician or
representative by the Radiologist Assistant, and communication
documented in the PACS or [REDACTED].
IMPRESSION: 7 x 4 mm T1 hyperintense and hypoenhancing focus within the
mid-to-posterior aspect of the pituitary gland. This has an
appearance most suggestive of a Rathke's cleft cyst. However, a
pituitary microadenoma cannot be excluded. Correlate with relevant
endocrinologic laboratory values. Additionally, a pituitary protocol
brain MRI with contrast may be helpful for further characterization.

Cerebellar tonsillar ectopia (greater on the right) with the right
cerebellar tonsil extending 4-5 mm below the level of the foramen
magnum. This is borderline by measurement criteria for a Chiari 1
malformation. However, the cerebellar tonsils have a normal rounded
morphology, and there is no mass effect upon the brainstem.

Otherwise unremarkable MRI appearance of the brain.

Minimal mucosal thickening within the bilateral ethmoid and
maxillary sinuses.

## 2022-12-22 ENCOUNTER — Encounter: Payer: Self-pay | Admitting: Nurse Practitioner

## 2022-12-22 ENCOUNTER — Ambulatory Visit: Payer: BC Managed Care – PPO | Attending: Nurse Practitioner | Admitting: Nurse Practitioner

## 2022-12-22 DIAGNOSIS — J3089 Other allergic rhinitis: Secondary | ICD-10-CM

## 2022-12-22 DIAGNOSIS — J4520 Mild intermittent asthma, uncomplicated: Secondary | ICD-10-CM

## 2022-12-22 MED ORDER — CETIRIZINE HCL 10 MG PO TABS: 10.0000 mg | ORAL_TABLET | Freq: Every day | ORAL | 3 refills | Status: DC

## 2022-12-22 NOTE — Progress Notes (Signed)
Virtual Visit Note  I discussed the limitations, risks, security and privacy concerns of performing an evaluation and management service by video and the availability of in person appointments. I also discussed with the patient that there may be a patient responsible charge related to this service. The patient expressed understanding and agreed to proceed.    I connected with Roger Reynolds on 12/22/22  at   3:10 PM EDT  EDT by VIDEO and verified that I am speaking with the correct person using two identifiers.   Location of Patient: Private Residence   Location of Provider: [place] participating in VIRTUAL visit: Geryl Rankins FNP-BC [place] INTERPRE[employer]    History of Present Illness: VIRTUAL visit for: medication refills  He has a past medical history of asthma and allergic rhinitis. Asthma is well controlled and he does not endorse any coughing, wheezing or shortness of breath. Allergy symptoms seem to be enhanced and he is requesting a refill of zyrtec.      Past Medical History:  Diagnosis Date   Asthma     Past Surgical History:  Procedure Laterality Date   COLONOSCOPY WITH PROPOFOL N/A 09/02/2021   Procedure: COLONOSCOPY WITH PROPOFOL;  Surgeon: Virgel Manifold, MD;  Location: [facility] ENDOSCOPY;  Service: Gastroenterology;  Laterality: N/A;    Family History  Problem Relation Age of Onset   Stroke Mother    Alcohol abuse Father     Social History   Socioeconomic History   Marital status: Married    Spouse name: Not on file   Number of children: Not on file   Years of education: Not on file   Highest education level: Not on file  Occupational History   Not on file  Tobacco Use   Smoking status: Never   Smokeless tobacco: Never  Vaping Use   Vaping Use: Never used  Substance and Sexual Activity   Alcohol use: Yes    Comment: on occasion. non last 24hrs   Drug use: No   Sexual activity: Yes  Other  Topics Concern   Not on file  Social History Narrative   Not on file   Social Determinants of Health   Financial Resource Strain: Not on file  Food Insecurity: Not on file  Transportation Needs: Not on file  Physical Activity: Not on file  Stress: Not on file  Social Connections: Not on file     Observations/Objective: Awake, alert and oriented x 3   Review of Systems  Constitutional:  Negative for fever, malaise/fatigue and weight loss.  HENT: Negative.  Negative for nosebleeds.   Eyes: Negative.  Negative for blurred vision, double vision and photophobia.  Respiratory: Negative.  Negative for cough and shortness of breath.   Cardiovascular: Negative.  Negative for chest pain, palpitations and leg swelling.  Gastrointestinal: Negative.  Negative for heartburn, nausea and vomiting.  Musculoskeletal: Negative.  Negative for myalgias.  Neurological: Negative.  Negative for dizziness, focal weakness, seizures and headaches.  Endo/Heme/Allergies:  Positive for environmental allergies.  Psychiatric/Behavioral: Negative.  Negative for suicidal ideas.     Assessment and Plan: Diagnoses and all orders for this visit:  Environmental and seasonal allergies -     cetirizine (ZYRTEC) 10 MG tablet; Take 1 tablet (10 mg total) by mouth daily.  Mild intermittent asthma without complication -     cetirizine (ZYRTEC) 10 MG tablet; Take 1 tablet (10 mg total) by mouth daily.  Follow Up Instructions Return in about 6 months (around 06/24/2023) for physical.     I discussed the assessment and treatment plan with the patient. The patient was provided an opportunity to ask questions and all were answered. The patient agreed with the plan and demonstrated an understanding of the instructions.   The patient was advised to call back or seek an in-person evaluation if the symptoms worsen or if the condition fails to improve as anticipated.  I provided 11 minutes of face-to-face time during  this encounter including median intraservice time, reviewing previous notes, labs, imaging, medications and explaining diagnosis and management.  Gildardo Pounds, FNP-BC

## 2023-01-10 ENCOUNTER — Ambulatory Visit (LOCAL_COMMUNITY_HEALTH_CENTER): Payer: BC Managed Care – PPO

## 2023-01-10 DIAGNOSIS — Z719 Counseling, unspecified: Secondary | ICD-10-CM

## 2023-01-10 DIAGNOSIS — Z23 Encounter for immunization: Secondary | ICD-10-CM | POA: Diagnosis not present

## 2023-01-10 NOTE — Progress Notes (Signed)
Pt seen in nurse clinic for vaccines needed for Immigration requirement accompanied by wife. Pt requested Covid 19 Pfizer, Tdap, HepB (Heplisav-B), MMR, and Varicella. Eligible from Private vaccine supply. Administered vaccines, monitored for 15 minutes without problems. Given VIS and NCIR copies, informed of next vaccine visits, pt verbalized understanding. M.Nathasha Fiorillo, LPN.

## 2023-01-19 ENCOUNTER — Telehealth: Payer: Self-pay

## 2023-01-19 NOTE — Telephone Encounter (Signed)
Records has been printed and placed upfront for pick up.     Copied from CRM [id number]. Topic: Medical Record Request - Other >> Jan 03, 2023  1:28 PM Marlow Baars wrote: Reason for CRM: The patients daughter called in stating the patient wants to come by to get a copy of his immunization records for his immigration status. Please assist patient further as to what he needs to do or sign

## 2023-02-08 ENCOUNTER — Ambulatory Visit: Payer: BC Managed Care – PPO

## 2023-03-22 ENCOUNTER — Ambulatory Visit: Payer: BC Managed Care – PPO | Admitting: Dermatology

## 2023-03-22 VITALS — BP 106/82

## 2023-03-22 DIAGNOSIS — Z7189 Other specified counseling: Secondary | ICD-10-CM

## 2023-03-22 DIAGNOSIS — L821 Other seborrheic keratosis: Secondary | ICD-10-CM

## 2023-03-22 DIAGNOSIS — D171 Benign lipomatous neoplasm of skin and subcutaneous tissue of trunk: Secondary | ICD-10-CM

## 2023-03-22 NOTE — Patient Instructions (Addendum)
Lipomas Benign-appearing. Exam most consistent with a lipoma. Discussed that a lipoma is a benign fatty growth that can grow over time and sometimes get irritated. Recommend observation if it is not bothersome or changing. Discussed option of ILK injections or surgical excision to remove it if it is growing, symptomatic, or other changes noted. Please call for new or changing lesions so they can be evaluated.     Due to recent changes in healthcare laws, you may see results of your pathology and/or laboratory studies on MyChart before the doctors have had a chance to review them. We understand that in some cases there may be results that are confusing or concerning to you. Please understand that not all results are received at the same time and often the doctors may need to interpret multiple results in order to provide you with the best plan of care or course of treatment. Therefore, we ask that you please give [place] 2 business days to thoroughly review all your results before contacting the office for clarification. Should we see a critical lab result, you will be contacted sooner.   If You Need Anything After Your Visit  If you have any questions or concerns for your doctor, please call our main line at [phone] and press option 4 to reach your doctor's medical assistant. If no one answers, please leave a voicemail as directed and we will return your call as soon as possible. Messages left after 4 pm will be answered the following business day.   You may also send [place] a message via MyChart. We typically respond to MyChart messages within 1-2 business days.  For prescription refills, please ask your pharmacy to contact our office. Our fax number is [phone].  If you have an urgent issue when the clinic is closed that cannot wait until the next business day, you can page your doctor at the number below.    Please note that while we do our best to be available for urgent issues outside of  office hours, we are not available 24/7.   If you have an urgent issue and are unable to reach [place], you may choose to seek medical care at your doctor's office, retail clinic, urgent care center, or emergency room.  If you have a medical emergency, please immediately call 911 or go to the emergency department.  Pager Numbers  - Dr. Gwen Pounds: [phone]  - Dr. Neale Burly: [phone]  - Dr. Roseanne Reno: [phone]  In the event of inclement weather, please call our main line at [phone] for an update on the status of any delays or closures.  Dermatology Medication Tips: Please keep the boxes that topical medications come in in order to help keep track of the instructions about where and how to use these. Pharmacies typically print the medication instructions only on the boxes and not directly on the medication tubes.   If your medication is too expensive, please contact our office at [phone] option 4 or send [place] a message through MyChart.   We are unable to tell what your co-pay for medications will be in advance as this is different depending on your insurance coverage. However, we may be able to find a substitute medication at lower cost or fill out paperwork to get insurance to cover a needed medication.   If a prior authorization is required to get your medication covered by your insurance company, please allow [place] 1-2 business days to complete this process.  Drug prices often vary depending on where the  prescription is filled and some pharmacies may offer cheaper prices.  The website www.goodrx.com contains coupons for medications through different pharmacies. The prices here do not account for what the cost may be with help from insurance (it may be cheaper with your insurance), but the website can give you the price if you did not use any insurance.  - You can print the associated coupon and take it with your prescription to the pharmacy.  - You may also stop by our office during  regular business hours and pick up a GoodRx coupon card.  - If you need your prescription sent electronically to a different pharmacy, notify our office through [facility] or by phone at [phone] option 4.     Si Usted Necesita Algo Despus de Su Visita  Tambin puede enviarnos un mensaje a travs de Clinical cytogeneticist. Por lo general respondemos a los mensajes de MyChart en el transcurso de 1 a 2 das hbiles.  Para renovar recetas, por favor pida a su farmacia que se ponga en contacto con nuestra oficina. Annie Sable de fax es [place] [phone].  Si tiene un asunto urgente cuando la clnica est cerrada y que no puede esperar hasta el siguiente da hbil, puede llamar/localizar a su doctor(a) al nmero que aparece a continuacin.   Por favor, tenga en cuenta que aunque hacemos todo lo posible para estar disponibles para asuntos urgentes fuera del horario de [place], no estamos disponibles las 24 horas del da, los 7 [address]  [address] de la [place].   Si tiene un problema urgente y no puede comunicarse con nosotros, puede optar por buscar atencin mdica  en el consultorio de su doctor(a), en una clnica privada, en un centro de atencin urgente o en una sala de emergencias.  Si tiene Engineer, drilling, por favor llame inmediatamente al 911 o vaya a la sala de emergencias.  Nmeros de bper  - Dr. Gwen Pounds: [phone]  - Dra. Moye: [phone]  - Dra. Roseanne Reno: [phone]  En caso de inclemencias del [place], por favor llame a Lacy Duverney principal al [phone] para una actualizacin sobre el [place] de cualquier retraso o cierre.  Consejos para la medicacin en dermatologa: Por favor, guarde las cajas en las que vienen los medicamentos de uso tpico para ayudarle a seguir las instrucciones sobre dnde y cmo usarlos. Las farmacias generalmente imprimen las instrucciones del medicamento slo en las cajas y no directamente en los tubos del [place].   Si su medicamento es muy  caro, por favor, pngase en contacto con Rolm Gala llamando al [phone] y presione la opcin 4 o envenos un mensaje a travs de Clinical cytogeneticist.   No podemos decirle cul ser su copago por los medicamentos por adelantado ya que esto es diferente dependiendo de la cobertura de su seguro. Sin embargo, es posible que podamos encontrar un medicamento sustituto a Audiological scientist un formulario para que el seguro cubra el medicamento que se considera necesario.   Si se requiere una autorizacin previa para que su compaa de seguros [place] su medicamento, por favor permtanos de 1 a 2 das hbiles para completar [address]  Los precios de los medicamentos varan con frecuencia dependiendo del Environmental consultant de dnde se surte la receta y alguna farmacias pueden ofrecer precios ms baratos.  El sitio web www.goodrx.com tiene cupones para medicamentos de Health and safety inspector. Los precios aqu no tienen en cuenta lo que podra costar con la ayuda del seguro (puede ser ms barato con su seguro), pero el sitio web  puede darle el precio si no utiliz ningn seguro.  - Puede imprimir el cupn correspondiente y llevarlo con su receta a la farmacia.  - Tambin puede pasar por nuestra oficina durante el horario de atencin regular y recoger una tarjeta de cupones de GoodRx.  - Si necesita que su receta se enve electrnicamente a una farmacia diferente, informe a nuestra oficina a travs de MyChart de [place] o por telfono llamando al [phone] y presione la opcin 4.  

## 2023-03-22 NOTE — Progress Notes (Signed)
   New Patient Visit   Subjective  Roger Reynolds is a 47 y.o. male who presents for the following: growths over body, 62yrs, started with one on L side and then started getting them on chest, arms The patient has spots, moles and lesions to be evaluated, some may be new or changing and the patient may have concern these could be cancer.  New patient referral from Bertram Denver, NP.  Patient accompanied by interpreter.  The following portions of the chart were reviewed this encounter and updated as appropriate: medications, allergies, medical history  Review of Systems:  No other skin or systemic complaints except as noted in HPI or Assessment and Plan.  Objective  Well appearing patient in no apparent distress; mood and affect are within normal limits.  A focused examination was performed of the following areas: trunk, arms  Relevant exam findings are noted in the Assessment and Plan.   Assessment & Plan  Lipoma  Exam: Subcutaneous rubbery nodule(s) R lat waist ~3.2 x 1.1cm, R sup chest 1.1cm, L pectoral 1.1cm, L lat abdomen near the side 2.2cm  Treatment: Benign-appearing. Exam most consistent with a lipoma. Discussed that a lipoma is a benign fatty growth that can grow over time and sometimes get irritated. Recommend observation if it is not bothersome or changing. Discussed option of  surgical excision to remove it if it is growing, symptomatic, or other changes noted. Please call for new or changing lesions so they can be evaluated.   Discussed if patient does want to excise we will excise 1 per surgery.  Discussed if over a year and pt decides to remove we will re evaluate before surgery.  SEBORRHEIC KERATOSIS - Stuck-on, waxy, tan-brown papules and/or plaques  - Benign-appearing - Discussed benign etiology and prognosis. - Observe - Call for any changes  Return if symptoms worsen or fail to improve.  I, Ardis Rowan, RMA, am acting as scribe for Armida Sans, MD  .  Documentation: I have reviewed the above documentation for accuracy and completeness, and I agree with the above.  Armida Sans, MD

## 2023-03-24 ENCOUNTER — Encounter: Payer: Self-pay | Admitting: Dermatology

## 2023-05-25 ENCOUNTER — Encounter: Payer: Self-pay | Admitting: Nurse Practitioner

## 2023-05-25 ENCOUNTER — Ambulatory Visit: Payer: BC Managed Care – PPO | Attending: Nurse Practitioner | Admitting: Nurse Practitioner

## 2023-05-25 VITALS — BP 121/80 | HR 65 | Ht 64.5 in | Wt 181.6 lb

## 2023-05-25 DIAGNOSIS — R253 Fasciculation: Secondary | ICD-10-CM | POA: Diagnosis not present

## 2023-05-25 DIAGNOSIS — G8929 Other chronic pain: Secondary | ICD-10-CM

## 2023-05-25 DIAGNOSIS — R252 Cramp and spasm: Secondary | ICD-10-CM | POA: Diagnosis not present

## 2023-05-25 DIAGNOSIS — M25551 Pain in right hip: Secondary | ICD-10-CM

## 2023-05-25 MED ORDER — TIZANIDINE HCL 4 MG PO TABS
4.0000 mg | ORAL_TABLET | Freq: Four times a day (QID) | ORAL | 1 refills | Status: DC | PRN
Start: 2023-05-25 — End: 2023-06-01

## 2023-05-25 NOTE — Progress Notes (Signed)
Assessment & Plan:  Roger Reynolds was seen today for annual exam.  Diagnoses and all orders for this visit:  Chronic right hip pain -     DG Hip Unilat W OR W/O Pelvis Min 4 Views Right; Future  Leg cramping -     tiZANidine (ZANAFLEX) 4 MG tablet; Take 1 tablet (4 mg total) by mouth every 6 (six) hours as needed for muscle spasms. For leg cramps  Twitching -     Magnesium -     CMP14+EGFR -     CBC with Differential    Patient has been counseled on age-appropriate routine health concerns for screening and prevention. These are reviewed and up-to-date. Referrals have been placed accordingly. Immunizations are up-to-date or declined.    Subjective:   Chief Complaint  Patient presents with   Annual Exam    Left eye twitching and right hip pain for 6 months comes and goes   HPI Roger Reynolds 47 y.o. male presents to office today with complaints of left eye twitching, right hip pain and bilateral leg cramps.    He is accompanied by his wife today     VRI was used to communicate directly with patient for the entire encounter including providing detailed patient instructions.     Right Hip pain Ongoing for the past several months.  Aggravating factors tend to be to standing position.  Unrelated to any injury or trauma.  Pain is mostly in the posterior hip.  He has not tried any over-the-counter medications for pain.   Notes intermittent left eye twitching. He has seen an eye doctor for this and was instructed to take magnesium, calcium and zinc.  He states the twitching episodes decreased while he was taking the vitamin however he ran out a few weeks ago and the eye twitching has resolved.  It is occurring infrequently   Notes tightness sensation or cramping in bilateral legs mostly occurring at night when lying down or after work.  Onset several months ago.  He has not tried any medications over-the-counter for this.     Review of Systems  Constitutional:  Negative for fever,  malaise/fatigue and weight loss.  HENT: Negative.  Negative for nosebleeds.   Eyes:  Negative for blurred vision, double vision, photophobia, pain, discharge and redness.       See HPI  Respiratory: Negative.  Negative for cough and shortness of breath.   Cardiovascular: Negative.  Negative for chest pain, palpitations and leg swelling.  Gastrointestinal: Negative.  Negative for heartburn, nausea and vomiting.  Musculoskeletal:  Positive for joint pain. Negative for myalgias.  Neurological: Negative.  Negative for dizziness, focal weakness, seizures and headaches.  Psychiatric/Behavioral: Negative.  Negative for suicidal ideas.     Past Medical History:  Diagnosis Date   Asthma     Past Surgical History:  Procedure Laterality Date   COLONOSCOPY WITH PROPOFOL N/A 09/02/2021   Procedure: COLONOSCOPY WITH PROPOFOL;  Surgeon: Pasty Spillers, MD;  Location: [facility] ENDOSCOPY;  Service: Gastroenterology;  Laterality: N/A;    Family History  Problem Relation Age of Onset   Stroke Mother    Alcohol abuse Father     Social History Reviewed with no changes to be made today.   Outpatient Medications Prior to Visit  Medication Sig Dispense Refill   albuterol (VENTOLIN HFA) 108 (90 Base) MCG/ACT inhaler TAKE 2 PUFFS BY MOUTH EVERY 6 HOURS AS NEEDED FOR WHEEZE OR SHORTNESS OF BREATH 8 g 1   cetirizine (ZYRTEC) 10  MG tablet Take 1 tablet (10 mg total) by mouth daily. 90 tablet 3   ketoconazole (NIZORAL) 2 % shampoo Apply 1 application topically 2 (two) times a week. 120 mL 1   No facility-administered medications prior to visit.    No Known Allergies     Objective:    BP 121/80 (BP Location: Left Arm, Patient Position: Sitting, Cuff Size: Normal)   Pulse 65   Ht 5' 4.5" (1.638 m)   Wt 181 lb 9.6 oz (82.4 kg)   SpO2 97%   BMI 30.69 kg/m  Wt Readings from Last 3 Encounters:  05/25/23 181 lb 9.6 oz (82.4 kg)  06/23/22 179 lb (81.2 kg)  09/02/21 173 lb (78.5 kg)    Physical  Exam Vitals and nursing note reviewed.  Constitutional:      Appearance: He is well-developed.  HENT:     Head: Normocephalic and atraumatic.  Cardiovascular:     Rate and Rhythm: Normal rate and regular rhythm.     Heart sounds: Normal heart sounds. No murmur heard.    No friction rub. No gallop.  Pulmonary:     Effort: Pulmonary effort is normal. No tachypnea or respiratory distress.     Breath sounds: Normal breath sounds. No decreased breath sounds, wheezing, rhonchi or rales.  Chest:     Chest wall: No tenderness.  Abdominal:     General: Bowel sounds are normal.     Palpations: Abdomen is soft.  Musculoskeletal:        General: Normal range of motion.     Cervical back: Normal range of motion.       Legs:  Skin:    General: Skin is warm and dry.  Neurological:     Mental Status: He is alert and oriented to person, place, and time.     Coordination: Coordination normal.  Psychiatric:        Behavior: Behavior normal. Behavior is cooperative.        Thought Content: Thought content normal.        Judgment: Judgment normal.          Patient has been counseled extensively about nutrition and exercise as well as the importance of adherence with medications and regular follow-up. The patient was given clear instructions to go to ER or return to medical center if symptoms don't improve, worsen or new problems develop. The patient verbalized understanding.   Follow-up: Return in about 6 months (around 11/23/2023).   Claiborne Rigg, FNP-BC [facility] and [facility] [place], [place] [phone]   05/25/2023, 11:02 AM

## 2023-05-26 LAB — CMP14+EGFR
ALT: 26 IU/L (ref 0–44)
AST: 25 IU/L (ref 0–40)
Albumin: 4.5 g/dL (ref 4.1–5.1)
Alkaline Phosphatase: 89 IU/L (ref 44–121)
BUN/Creatinine Ratio: 14 (ref 9–20)
BUN: 13 mg/dL (ref 6–24)
Bilirubin Total: 0.4 mg/dL (ref 0.0–1.2)
CO2: 23 mmol/L (ref 20–29)
Calcium: 9.8 mg/dL (ref 8.7–10.2)
Chloride: 104 mmol/L (ref 96–106)
Creatinine, Ser: 0.94 mg/dL (ref 0.76–1.27)
Globulin, Total: 2.1 g/dL (ref 1.5–4.5)
Glucose: 90 mg/dL (ref 70–99)
Potassium: 4.7 mmol/L (ref 3.5–5.2)
Sodium: 139 mmol/L (ref 134–144)
Total Protein: 6.6 g/dL (ref 6.0–8.5)
eGFR: 99 mL/min/{1.73_m2} (ref >59)

## 2023-05-26 LAB — CBC WITH DIFFERENTIAL/PLATELET
Basophils Absolute: 0 10*3/uL (ref 0.0–0.2)
Basos: 1 %
EOS (ABSOLUTE): 0.1 10*3/uL (ref 0.0–0.4)
Eos: 1 %
Hematocrit: 48.6 % (ref 37.5–51.0)
Hemoglobin: 16.1 g/dL (ref 13.0–17.7)
Immature Grans (Abs): 0 10*3/uL (ref 0.0–0.1)
Immature Granulocytes: 0 %
Lymphocytes Absolute: 3.2 10*3/uL — ABNORMAL HIGH (ref 0.7–3.1)
Lymphs: 45 %
MCH: 30.3 pg (ref 26.6–33.0)
MCHC: 33.1 g/dL (ref 31.5–35.7)
MCV: 91 fL (ref 79–97)
Monocytes Absolute: 0.6 10*3/uL (ref 0.1–0.9)
Monocytes: 8 %
Neutrophils Absolute: 3.2 10*3/uL (ref 1.4–7.0)
Neutrophils: 45 %
Platelets: 251 10*3/uL (ref 150–450)
RBC: 5.32 x10E6/uL (ref 4.14–5.80)
RDW: 12.7 % (ref 11.6–15.4)
WBC: 7.1 10*3/uL (ref 3.4–10.8)

## 2023-05-26 LAB — MAGNESIUM: Magnesium: 2.3 mg/dL (ref 1.6–2.3)

## 2023-06-01 ENCOUNTER — Other Ambulatory Visit: Payer: Self-pay | Admitting: Nurse Practitioner

## 2023-06-01 DIAGNOSIS — R252 Cramp and spasm: Secondary | ICD-10-CM

## 2023-07-19 ENCOUNTER — Ambulatory Visit
Admission: RE | Admit: 2023-07-19 | Discharge: 2023-07-19 | Disposition: A | Discharge status: Home / Self Care | Payer: BC Managed Care – PPO | Source: Ambulatory Visit | Attending: Nurse Practitioner | Admitting: Nurse Practitioner

## 2023-07-19 ENCOUNTER — Other Ambulatory Visit: Payer: Self-pay | Admitting: Nurse Practitioner

## 2023-07-19 DIAGNOSIS — G8929 Other chronic pain: Secondary | ICD-10-CM

## 2023-07-19 DIAGNOSIS — R253 Fasciculation: Secondary | ICD-10-CM

## 2023-07-19 DIAGNOSIS — R252 Cramp and spasm: Secondary | ICD-10-CM

## 2023-07-19 DIAGNOSIS — M25551 Pain in right hip: Secondary | ICD-10-CM | POA: Diagnosis not present

## 2024-01-11 ENCOUNTER — Other Ambulatory Visit: Payer: Self-pay | Admitting: Nurse Practitioner

## 2024-01-11 DIAGNOSIS — J4520 Mild intermittent asthma, uncomplicated: Secondary | ICD-10-CM

## 2024-01-11 MED ORDER — ALBUTEROL SULFATE HFA 108 (90 BASE) MCG/ACT IN AERS: INHALATION_SPRAY | RESPIRATORY_TRACT | 0 refills | Status: DC

## 2024-01-11 NOTE — Telephone Encounter (Signed)
 Requested medication (s) are due for refill today: expired medication date  Requested medication (s) are on the active medication list: yes   Last refill:  06/23/22 #8g 1 refill  Future visit scheduled: no   Notes to clinic:  expired medication date. Do you want to renew Rx?     Requested Prescriptions  Pending Prescriptions Disp Refills   albuterol  (VENTOLIN  HFA) 108 (90 Base) MCG/ACT inhaler      Sig: TAKE 2 PUFFS BY MOUTH EVERY 6 HOURS AS NEEDED FOR WHEEZE OR SHORTNESS OF BREATH     Pulmonology:  Beta Agonists 2 Passed - 01/11/2024  2:58 PM      Passed - Last BP in normal range    BP Readings from Last 1 Encounters:  05/25/23 121/80         Passed - Last Heart Rate in normal range    Pulse Readings from Last 1 Encounters:  05/25/23 65         Passed - Valid encounter within last 12 months    Recent Outpatient Visits           7 months ago Chronic right hip pain   [facility] Theotis Haze ORN, NP   1 year ago Environmental and seasonal allergies   [facility] [place] NP   1 year ago Encounter to establish care   [facility] [place] Theotis Haze ORN, [place]

## 2024-01-11 NOTE — Telephone Encounter (Signed)
 Copied from CRM [id number]. Topic: Clinical - Medication Refill >> Jan 11, 2024 10:30 AM Loreda Rodriguez T wrote: Most Recent Primary Care Visit:  Provider: Winda Hastings W  Department: [facility]  Visit Type: OFFICE VISIT  Date: 05/25/2023  Medication: albuterol  (VENTOLIN  HFA) 108 (90 Base) MCG/ACT inhaler   Has the patient contacted their pharmacy? No  Is this the correct pharmacy for this prescription? Yes If no, delete pharmacy and type the correct one.  This is the patient's preferred pharmacy:  [facility] [address] [place] - [address] [place] [address] Phone: [phone] Fax: [phone]   Has the prescription been filled recently? Yes  Is the patient out of the medication? Yes  Has the patient been seen for an appointment in the last year OR does the patient have an upcoming appointment? Yes  Can we respond through MyChart? No  Agent: Please be advised that Rx refills may take up to 3 business days. We ask that you follow-up with your pharmacy.

## 2024-01-31 ENCOUNTER — Other Ambulatory Visit: Payer: Self-pay

## 2024-01-31 ENCOUNTER — Ambulatory Visit: Payer: Self-pay

## 2024-01-31 DIAGNOSIS — J4520 Mild intermittent asthma, uncomplicated: Secondary | ICD-10-CM

## 2024-01-31 MED ORDER — ALBUTEROL SULFATE HFA 108 (90 BASE) MCG/ACT IN AERS: INHALATION_SPRAY | RESPIRATORY_TRACT | 0 refills | Status: DC

## 2024-01-31 NOTE — Telephone Encounter (Signed)
 Copied from CRM [id number]. Topic: Clinical - Red Word Triage >> Jan 31, 2024  2:14 PM Ethelle Herb L wrote: Red Word that prompted transfer to Nurse Triage:  cough, sob due to allergies. Needing inhaler rx refill.   Interpreter [place], [id number]  Chief Complaint: Cough, SOB, wheezing. 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 Symptoms: Above Frequency: 1 month Pertinent Negatives: Patient denies fever Disposition: [] ED /[] Urgent Care (no appt availability in office) / [] Appointment(In office/virtual)/ []  [facility] [place] [facility]/ [] Home Care/ [] Refused Recommended Disposition /[] [place] [facility]/ [x]  Follow-up with PCP Additional Notes: Please advise pt.  Reason for Disposition  [1] Continuous (nonstop) coughing interferes with work or school AND [2] no improvement using cough treatment per Care Advice  Answer Assessment - Initial Assessment Questions 1. ONSET: "When did the cough begin?"      1 month 2. SEVERITY: "How bad is the cough today?"      Severe 3. SPUTUM: "Describe the color of your sputum" (none, dry cough; clear, white, yellow, green)     Green 4. HEMOPTYSIS: "Are you coughing up any blood?" If so ask: "How much?" (flecks, streaks, tablespoons, etc.)     No 5. DIFFICULTY BREATHING: "Are you having difficulty breathing?" If Yes, ask: "How bad is it?" (e.g., mild, moderate, severe)    - MILD: No SOB at rest, mild SOB with walking, speaks normally in sentences, can lie down, no retractions, pulse < 100.    - MODERATE: SOB at rest, SOB with minimal exertion and prefers to sit, cannot lie down flat, speaks in phrases, mild retractions, audible wheezing, pulse 100-120.    - SEVERE: Very SOB at rest, speaks in single words, struggling to breathe, sitting hunched forward, retractions, pulse > 120      Mild 6. FEVER: "Do you have a fever?" If Yes, ask: "What is your temperature, how was it measured, and when did it start?"     No 7. CARDIAC HISTORY: "Do you have any history of heart disease?"  (e.g., heart attack, congestive heart failure)      No 8. LUNG HISTORY: "Do you have any history of lung disease?"  (e.g., pulmonary embolus, asthma, emphysema)     Asthma 9. PE RISK FACTORS: "Do you have a history of blood clots?" (or: recent major surgery, recent prolonged travel, bedridden)     No 10. OTHER SYMPTOMS: "Do you have any other symptoms?" (e.g., runny nose, wheezing, chest pain)       Wheezing 11. PREGNANCY: "Is there any chance you are pregnant?" "When was your last menstrual period?"       N/a 12. TRAVEL: "Have you traveled out of the country in the last month?" (e.g., travel history, exposures)       No  Protocols used: Cough - Acute Productive-A-AH

## 2024-01-31 NOTE — Telephone Encounter (Signed)
 Refill sent.

## 2024-02-27 NOTE — Progress Notes (Unsigned)
   Established Patient Office Visit  Subjective   Patient ID: Roger Reynolds, male    DOB: 04/27/1976  Age: 48 y.o. MRN: [id number]  No chief complaint on file.   Pcp [facility] Last seen 04/2023    {History (Optional):23778}  ROS    Objective:     There were no vitals taken for this visit. {Vitals History (Optional):23777}  Physical Exam   No results found for any visits on 02/28/24.  {Labs (Optional):23779}  The 10-year ASCVD risk score (Arnett DK, et al., 2019) is: 2.9%    Assessment & Plan:   Problem List Items Addressed This Visit   None   No follow-ups on file.    Arlene Lacy, MD

## 2024-02-28 ENCOUNTER — Ambulatory Visit: Attending: Critical Care Medicine | Admitting: Critical Care Medicine

## 2024-02-28 ENCOUNTER — Encounter: Payer: Self-pay | Admitting: Critical Care Medicine

## 2024-02-28 VITALS — BP 108/69 | HR 60 | Temp 98.1°F | Ht 64.0 in | Wt 175.0 lb

## 2024-02-28 DIAGNOSIS — D369 Benign neoplasm, unspecified site: Secondary | ICD-10-CM | POA: Diagnosis not present

## 2024-02-28 DIAGNOSIS — J4551 Severe persistent asthma with (acute) exacerbation: Secondary | ICD-10-CM

## 2024-02-28 DIAGNOSIS — J3089 Other allergic rhinitis: Secondary | ICD-10-CM | POA: Diagnosis not present

## 2024-02-28 DIAGNOSIS — J4520 Mild intermittent asthma, uncomplicated: Secondary | ICD-10-CM | POA: Diagnosis not present

## 2024-02-28 MED ORDER — PREDNISONE 10 MG PO TABS: ORAL_TABLET | ORAL | 0 refills | Status: AC

## 2024-02-28 MED ORDER — CETIRIZINE HCL 10 MG PO TABS: 10.0000 mg | ORAL_TABLET | Freq: Every day | ORAL | 3 refills | Status: AC

## 2024-02-28 MED ORDER — ALBUTEROL SULFATE HFA 108 (90 BASE) MCG/ACT IN AERS: INHALATION_SPRAY | RESPIRATORY_TRACT | 1 refills | Status: AC

## 2024-02-28 MED ORDER — FLUTICASONE-SALMETEROL 115-21 MCG/ACT IN AERO: 2.0000 | INHALATION_SPRAY | Freq: Two times a day (BID) | RESPIRATORY_TRACT | 12 refills | Status: AC

## 2024-02-28 NOTE — Patient Instructions (Addendum)
 A referral to asthma clinic will be made the clinic is in [facility] in [place] where you live  Take prednisone  4 pills daily for 5 days for asthma  Start Advair inhaler 2 puffs twice daily on a regular basis and use the albuterol  2 puffs every 4-6 hours as needed  Use your peak flow meter to check your flow rates once or twice a day if you fall less than 150 please call us   Keep a record of your peak flow rates and take all of those with you to the asthma doctor appointment  We recommend observation of the skin tag we will give you an anti-itch medication you can take orally  Start cetirizine  back daily for allergies  Return to see your primary care provider in 6 months  Se le derivar a una clnica para el asma. La clnica se encuentra en [facility] de  en [place], donde usted reside.  Tome 4 pastillas de prednisona al da durante 5 das para el asma.  Comience a usar el inhalador Advair con 2 inhalaciones dos veces al da regularmente y use albuterol  con 2 inhalaciones cada 4-6 horas segn sea necesario.  Use su medidor de flujo mximo para controlar sus tasas de flujo una o dos veces al da. Si la presin arterial baja a menos de 150, llmenos.  Lleve un registro de sus tasas de flujo mximo y llvelas consigo a la cita con el mdico especialista en asma.  Recomendamos observar el acrocordn. Le daremos un medicamento contra la picazn que puede tomar por va oral.  Comience a usar cetirizina diariamente para las Environmental consultant.  Regrese a la consulta con su mdico de cabecera en 6 meses.

## 2024-02-29 DIAGNOSIS — J3089 Other allergic rhinitis: Secondary | ICD-10-CM | POA: Insufficient documentation

## 2024-02-29 DIAGNOSIS — D369 Benign neoplasm, unspecified site: Secondary | ICD-10-CM | POA: Insufficient documentation

## 2024-02-29 DIAGNOSIS — J4551 Severe persistent asthma with (acute) exacerbation: Secondary | ICD-10-CM | POA: Insufficient documentation

## 2024-02-29 MED ORDER — HYDROXYZINE PAMOATE 25 MG PO CAPS: 25.0000 mg | ORAL_CAPSULE | Freq: Three times a day (TID) | ORAL | 0 refills | Status: AC | PRN

## 2024-02-29 NOTE — Assessment & Plan Note (Signed)
 Severe persistent asthma low peak flow rates plan pulsed prednisone  begin Flovent 2 puffs twice daily inhaler renew albuterol  patient given a peak flow meter and taught how to use this device taught how to use his HFA inhalers properly  Referral to asthma clinic made  Allergic manifestations likely precipitating factor renew cetirizine  give Flonase nasal

## 2024-02-29 NOTE — Assessment & Plan Note (Signed)
 Involving right anterior chest quite small told patient will observe

## 2024-04-25 ENCOUNTER — Encounter: Payer: Self-pay | Admitting: Pulmonary Disease

## 2024-04-25 ENCOUNTER — Ambulatory Visit: Admitting: Pulmonary Disease

## 2024-04-25 VITALS — BP 124/72 | HR 62 | Temp 97.1°F | Ht 64.0 in | Wt 178.0 lb

## 2024-04-25 DIAGNOSIS — J4520 Mild intermittent asthma, uncomplicated: Secondary | ICD-10-CM

## 2024-04-25 DIAGNOSIS — R0602 Shortness of breath: Secondary | ICD-10-CM | POA: Diagnosis not present

## 2024-04-25 LAB — NITRIC OXIDE: Nitric Oxide: 22

## 2024-04-25 NOTE — Progress Notes (Signed)
 Subjective:    Patient ID: Roger Reynolds, male    DOB: [birth year], 48 y.o.   MRN: [id number]  Patient Care Team: Theotis Haze ORN, NP as PCP - General (Nurse Practitioner)  Chief Complaint  Patient presents with   Consult    Diagnosed with asthma as a child. No SOB, wheezing, or cough.    BACKGROUND: Patient is a 48 year old lifelong never smoker with a history of asthma who presents for evaluation and management of the same.  He is kindly referred by Dr. Belvie Silvan.  Primary provider is Haze Theotis, NP.  *History was obtained directly from the patient communicating in his native language (Spanish) in which I am proficient.  Additionally, an interpreter was also readily available.  HPI Discussed the use of AI scribe software for clinical note transcription with the patient, who gave verbal consent to proceed.  History of Present Illness   Roger Reynolds is a 48 year old male with asthma who presents for evaluation of his condition. He was referred by Dr. Belvie Silvan for evaluation of asthma.  He has not experienced any asthma symptoms recently, noting that his asthma was more problematic when he was younger. He recalls a recent episode in June when his asthma symptoms flared up due to allergies, but he has been symptom-free since then.  During the June episode, he underwent a respiratory test but initially could not perform it well due to feeling full after a meal.  Reviewing the notes it this was a peak flow.  He later repeated the test at home and passed it. He did not take his asthma medications at that time because he felt well.  He is reluctant to take any medications for asthma.  He currently has two inhalers at home: albuterol  and Advair. He has not used them recently as he has not experienced symptoms such as chest tightness or wheezing.   He works in [employer].  As noted he is a lifelong never smoker.  We discussed the physiology of asthma in layman's terms.   Explained the rationale of maintenance medication use.  Patient is reluctant to use medications for asthma.  Review of Systems A 10 point review of systems was performed and it is as noted above otherwise negative.   Past Medical History:  Diagnosis Date   Asthma     Past Surgical History:  Procedure Laterality Date   COLONOSCOPY WITH PROPOFOL  N/A 09/02/2021   Procedure: COLONOSCOPY WITH PROPOFOL ;  Surgeon: Janalyn Keene NOVAK, MD;  Location: [facility] ENDOSCOPY;  Service: Gastroenterology;  Laterality: N/A;    Patient Active Problem List   Diagnosis Date Noted   Severe persistent asthma with exacerbation 02/29/2024   Environmental and seasonal allergies 02/29/2024   Dermoid cyst 02/29/2024   Prediabetes 05/03/2022   Colon cancer screening    Hair loss 01/24/2021   Intractable migraine with aura without status migrainosus 01/24/2021   Polyp of colon 09/25/2020   Obesity (BMI 30-39.9) 03/05/2020   Asthma 12/03/2015    Family History  Problem Relation Age of Onset   Stroke Mother    Alcohol abuse Father     Social History   Tobacco Use   Smoking status: Never   Smokeless tobacco: Never  Substance Use Topics   Alcohol use: Yes    Comment: on occasion. non last 24hrs    No Known Allergies  Current Meds  Medication Sig   albuterol  (VENTOLIN  HFA) 108 (90 Base) MCG/ACT inhaler TAKE 2 PUFFS BY MOUTH EVERY 6  HOURS AS NEEDED FOR WHEEZE OR SHORTNESS OF BREATH   cetirizine  (ZYRTEC ) 10 MG tablet Take 1 tablet (10 mg total) by mouth daily.   fluticasone -salmeterol (ADVAIR HFA) 115-21 MCG/ACT inhaler Inhale 2 puffs into the lungs 2 (two) times daily.   hydrOXYzine  (VISTARIL ) 25 MG capsule Take 1 capsule (25 mg total) by mouth every 8 (eight) hours as needed for itching.    Immunization History  Administered Date(s) Administered   Hepb-cpg 01/10/2023   Influenza-Unspecified 09/25/2016   MMR 01/10/2023   Pfizer(Comirnaty)Fall Seasonal Vaccine 12 years and older 01/10/2023    Pneumococcal Polysaccharide-23 12/03/2015   Td 02/20/2007   Tdap 03/05/2020, 01/10/2023   Varicella 01/10/2023        Objective:     BP 124/72 (BP Location: Right Arm, Cuff Size: Normal)   Pulse 62   Temp (!) 97.1 F (36.2 C)   Ht 5' 4 (1.626 m)   Wt 178 lb (80.7 kg)   SpO2 95%   BMI 30.55 kg/m   SpO2: 95 % O2 Device: None (Room air)  GENERAL: Well-developed, well-nourished gentleman, no acute distress.  Fully ambulatory, no conversational dyspnea. HEAD: Normocephalic, atraumatic.  EYES: Pupils equal, round, reactive to light.  No scleral icterus.  MOUTH: Dentition intact, oral mucosa moist.  No thrush. NECK: Supple. No thyromegaly. Trachea midline. No JVD.  No adenopathy. PULMONARY: Good air entry bilaterally.  No adventitious sounds. CARDIOVASCULAR: S1 and S2. Regular rate and rhythm.  No rubs, murmurs or gallops heard. ABDOMEN: Benign. MUSCULOSKELETAL: No joint deformity, no clubbing, no edema.  NEUROLOGIC: No overt focal deficit, no gait disturbance, speech is fluent. SKIN: Intact,warm,dry. PSYCH: Mood and behavior normal    Lab Results  Component Value Date   NITRICOXIDE 22 04/25/2024  *Very low level type II inflammation present.   Assessment & Plan:     ICD-10-CM   1. Intermittent asthma, unspecified asthma severity, unspecified whether complicated  J45.20 Pulmonary function test    2. SOB (shortness of breath)  R06.02 Nitric oxide       Orders Placed This Encounter  Procedures   Nitric oxide    Discussion:    Asthma Asthma is well-controlled with no recent exacerbations. Previous exacerbation in June was likely allergy-related. No current symptoms of chest tightness or wheezing. Inflammation tests show no significant airway inflammation. - Schedule pulmonary function test and follow-up visit in two months. - Instruct to use Advair daily, morning and night. - Advise to use albuterol  inhaler as needed for chest tightness. - Provide instructions for  symptom monitoring and seeking earlier appointment if symptoms worsen.      Advised if symptoms do not improve or worsen, to please contact office for sooner follow up or seek emergency care.    I spent 45 minutes of dedicated to the care of this patient on the date of this encounter to include pre-visit review of records, face-to-face time with the patient discussing conditions above, post visit ordering of testing, clinical documentation with the electronic health record, making appropriate referrals as documented, and communicating necessary findings to members of the patients care team.   C. Leita Sanders, MD [facility]    *This note was dictated using voice recognition software/Dragon.  Despite best efforts to proofread, errors can occur which can change the meaning. Any transcriptional errors that result from this process are unintentional and may not be fully corrected at the time of dictation.

## 2024-04-25 NOTE — Patient Instructions (Addendum)
 VISIT SUMMARY:  You visited us  today for an evaluation of your asthma. You reported that you have not experienced any asthma symptoms recently, although you had a flare-up in June due to allergies. You have two inhalers at home, albuterol  and Advair, but have not needed to use them recently.  YOUR PLAN:  -ASTHMA: Asthma is a condition where your airways narrow and swell, which can make it hard to breathe. Your asthma is currently well-controlled with no recent symptoms. We recommend that you use your Advair inhaler daily, both in the morning and at night. Use your albuterol  inhaler as needed if you experience chest tightness. If you notice any shortness of breath or wheezing, start using your inhaler and contact our clinic. We have also scheduled a pulmonary function test and a follow-up visit in two months. Please monitor your symptoms and seek an earlier appointment if they worsen.  You can monitor your symptoms with the peak flow meter.  INSTRUCTIONS:  Please schedule a pulmonary function test and a follow-up visit in two months. Use your Advair inhaler daily, morning and night, and your albuterol  inhaler as needed for chest tightness. Contact the clinic and start using your inhaler if you experience shortness of breath or wheezing. Monitor your symptoms and seek an earlier appointment if they worsen.   RESUMEN DE LA VISITA:  Nos visit hoy para una evaluacin de su asma. Inform que no ha experimentado ningn sntoma de asma recientemente, aunque tuvo un brote en junio debido a alergias. Tiene dos [employer], albuterol  y Advair, pero no los ha Research officer, trade [employer].  SU PLAN:  -ASMA: El asma es una afeccin en la que las vas respiratorias se estrechan e inflaman, lo que puede dificultar la respiracin. Actualmente, su asma est bien controlada y no ha presentado sntomas recientes. Le recomendamos que use su inhalador Advair a diario, tanto por la maana como por la noche.  Use su inhalador de albuterol  segn sea necesario si experimenta opresin en el pecho. Si nota dificultad para respirar o sibilancias, comience a Microbiologist y comunquese con [place]. Tambin hemos programado una prueba de funcin pulmonar y ignacia visita de seguimiento dentro de [employer]. Por favor, controle sus sntomas y solicite una cita anticipada si empeoran. Puede controlar sus sntomas con el medidor de flujo mximo.  INSTRUCCIONES:  Programe una prueba de funcin pulmonar y ignacia visita de seguimiento dentro de [employer]. Use su inhalador Advair a diario, por la maana y por la noche, y su inhalador de albuterol  segn sea necesario para la opresin en el pecho. Contacte con la clnica y comience a usar su inhalador si experimenta dificultad para respirar o sibilancias. Controle sus sntomas y solicite una cita anticipada si empeoran.

## 2024-04-30 ENCOUNTER — Encounter: Payer: Self-pay | Admitting: Pulmonary Disease

## 2024-07-03 ENCOUNTER — Encounter

## 2024-07-03 ENCOUNTER — Ambulatory Visit: Admitting: Pulmonary Disease

## 2024-09-11 ENCOUNTER — Encounter

## 2024-09-11 ENCOUNTER — Ambulatory Visit: Admitting: Pulmonary Disease

## 2024-11-21 ENCOUNTER — Encounter

## 2024-11-21 ENCOUNTER — Ambulatory Visit: Admitting: Pulmonary Disease
